# Patient Record
Sex: Female | Born: 1955 | Hispanic: Yes | Marital: Married | Smoking: Former smoker
Health system: Southern US, Community
[De-identification: ages and names within clinical notes are randomized; demographics above are authoritative.]

## PROBLEM LIST (undated history)

## (undated) DIAGNOSIS — I341 Nonrheumatic mitral (valve) prolapse: Secondary | ICD-10-CM

## (undated) DIAGNOSIS — J069 Acute upper respiratory infection, unspecified: Secondary | ICD-10-CM

## (undated) DIAGNOSIS — T4145XA Adverse effect of unspecified anesthetic, initial encounter: Secondary | ICD-10-CM

## (undated) DIAGNOSIS — I82409 Acute embolism and thrombosis of unspecified deep veins of unspecified lower extremity: Secondary | ICD-10-CM

## (undated) DIAGNOSIS — M797 Fibromyalgia: Secondary | ICD-10-CM

## (undated) DIAGNOSIS — R0602 Shortness of breath: Secondary | ICD-10-CM

## (undated) DIAGNOSIS — Z531 Procedure and treatment not carried out because of patient's decision for reasons of belief and group pressure: Secondary | ICD-10-CM

## (undated) DIAGNOSIS — R079 Chest pain, unspecified: Secondary | ICD-10-CM

## (undated) DIAGNOSIS — D649 Anemia, unspecified: Secondary | ICD-10-CM

## (undated) DIAGNOSIS — K589 Irritable bowel syndrome without diarrhea: Secondary | ICD-10-CM

## (undated) DIAGNOSIS — F32A Depression, unspecified: Secondary | ICD-10-CM

## (undated) DIAGNOSIS — J45909 Unspecified asthma, uncomplicated: Secondary | ICD-10-CM

## (undated) DIAGNOSIS — B009 Herpesviral infection, unspecified: Secondary | ICD-10-CM

## (undated) DIAGNOSIS — R51 Headache: Secondary | ICD-10-CM

## (undated) DIAGNOSIS — R011 Cardiac murmur, unspecified: Secondary | ICD-10-CM

## (undated) DIAGNOSIS — F329 Major depressive disorder, single episode, unspecified: Secondary | ICD-10-CM

## (undated) DIAGNOSIS — F419 Anxiety disorder, unspecified: Secondary | ICD-10-CM

## (undated) DIAGNOSIS — J189 Pneumonia, unspecified organism: Secondary | ICD-10-CM

## (undated) DIAGNOSIS — J302 Other seasonal allergic rhinitis: Secondary | ICD-10-CM

## (undated) DIAGNOSIS — I10 Essential (primary) hypertension: Secondary | ICD-10-CM

## (undated) DIAGNOSIS — T8859XA Other complications of anesthesia, initial encounter: Secondary | ICD-10-CM

## (undated) DIAGNOSIS — G4730 Sleep apnea, unspecified: Secondary | ICD-10-CM

## (undated) DIAGNOSIS — M1990 Unspecified osteoarthritis, unspecified site: Secondary | ICD-10-CM

## (undated) HISTORY — PX: OTHER SURGICAL HISTORY: SHX169

## (undated) HISTORY — PX: COLONOSCOPY: SHX174

---
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
## 1991-08-07 HISTORY — PX: BREAST SURGERY: SHX581

## 1992-08-06 HISTORY — PX: DILATION AND CURETTAGE OF UTERUS: SHX78

## 1993-08-06 HISTORY — PX: EYE SURGERY: SHX253

## 2014-03-24 ENCOUNTER — Other Ambulatory Visit: Payer: Self-pay | Admitting: Neurosurgery

## 2014-04-27 ENCOUNTER — Inpatient Hospital Stay (HOSPITAL_COMMUNITY)
Admission: RE | Admit: 2014-04-27 | Discharge: 2014-04-27 | Disposition: A | Discharge status: Home / Self Care | Payer: Self-pay | Source: Ambulatory Visit

## 2014-04-27 NOTE — Pre-Procedure Instructions (Signed)
Jill Collins  04/27/2014   Your procedure is scheduled on:  04/30/14  Report to [facility] short stay admitting at 530 AM.  Call this number if you have problems the morning of surgery: [phone]   Remember:   Do not eat food or drink liquids after midnight.   Take these medicines the morning of surgery with A SIP OF WATER:   STOP all herbel meds, nsaids (aleve,naproxen,advil,ibuprofen) now including vitamins, aspirin   Do not wear jewelry, make-up or nail polish.  Do not wear lotions, powders, or perfumes. You may wear deodorant.  Do not shave 48 hours prior to surgery. Men may shave face and neck.  Do not bring valuables to the hospital.  [facility] is not responsible                  for any belongings or valuables.               Contacts, dentures or bridgework may not be worn into surgery.  Leave suitcase in the car. After surgery it may be brought to your room.  For patients admitted to the hospital, discharge time is determined by your                treatment team.               Patients discharged the day of surgery will not be allowed to drive  home.  Name and phone number of your driver:   Special Instructions:  Special Instructions: [place] - Preparing for Surgery  Before surgery, you can play an important role.  Because skin is not sterile, your skin needs to be as free of germs as possible.  You can reduce the number of germs on you skin by washing with CHG (chlorahexidine gluconate) soap before surgery.  CHG is an antiseptic cleaner which kills germs and bonds with the skin to continue killing germs even after washing.  Please DO NOT use if you have an allergy to CHG or antibacterial soaps.  If your skin becomes reddened/irritated stop using the CHG and inform your nurse when you arrive at Short Stay.  Do not shave (including legs and underarms) for at least 48 hours prior to the first CHG shower.  You may shave your face.  Please follow these instructions  carefully:   1.  Shower with CHG Soap the night before surgery and the morning of Surgery.  2.  If you choose to wash your hair, wash your hair first as usual with your normal shampoo.  3.  After you shampoo, rinse your hair and body thoroughly to remove the Shampoo.  4.  Use CHG as you would any other liquid soap.  You can apply chg directly  to the skin and wash gently with scrungie or a clean washcloth.  5.  Apply the CHG Soap to your body ONLY FROM THE NECK DOWN.  Do not use on open wounds or open sores.  Avoid contact with your eyes ears, mouth and genitals (private parts).  Wash genitals (private parts)       with your normal soap.  6.  Wash thoroughly, paying special attention to the area where your surgery will be performed.  7.  Thoroughly rinse your body with warm water from the neck down.  8.  DO NOT shower/wash with your normal soap after using and rinsing off the CHG Soap.  9.  Pat yourself dry with a clean towel.  10.  Wear clean pajamas.            11.  Place clean sheets on your bed the night of your first shower and do not sleep with pets.  Day of Surgery  Do not apply any lotions/deodorants the morning of surgery.  Please wear clean clothes to the hospital/surgery center.   Please read over the following fact sheets that you were given: Pain Booklet, Coughing and Deep Breathing, MRSA Information and Surgical Site Infection Prevention

## 2014-04-28 ENCOUNTER — Ambulatory Visit (HOSPITAL_COMMUNITY)
Admission: RE | Admit: 2014-04-28 | Discharge: 2014-04-28 | Disposition: A | Discharge status: Home / Self Care | Payer: Medicaid Other | Source: Ambulatory Visit | Attending: Anesthesiology | Admitting: Anesthesiology

## 2014-04-28 ENCOUNTER — Encounter (HOSPITAL_COMMUNITY)
Admission: RE | Admit: 2014-04-28 | Discharge: 2014-04-28 | Disposition: A | Discharge status: Home / Self Care | Payer: Medicaid Other | Source: Ambulatory Visit | Attending: Neurosurgery | Admitting: Neurosurgery

## 2014-04-28 ENCOUNTER — Encounter (HOSPITAL_COMMUNITY): Payer: Self-pay

## 2014-04-28 DIAGNOSIS — R0602 Shortness of breath: Secondary | ICD-10-CM | POA: Diagnosis present

## 2014-04-28 HISTORY — DX: Unspecified osteoarthritis, unspecified site: M19.90

## 2014-04-28 HISTORY — DX: Pneumonia, unspecified organism: J18.9

## 2014-04-28 HISTORY — DX: Adverse effect of unspecified anesthetic, initial encounter: T41.45XA

## 2014-04-28 HISTORY — DX: Depression, unspecified: F32.A

## 2014-04-28 HISTORY — DX: Anemia, unspecified: D64.9

## 2014-04-28 HISTORY — DX: Chest pain, unspecified: R07.9

## 2014-04-28 HISTORY — DX: Unspecified asthma, uncomplicated: J45.909

## 2014-04-28 HISTORY — DX: Fibromyalgia: M79.7

## 2014-04-28 HISTORY — DX: Sleep apnea, unspecified: G47.30

## 2014-04-28 HISTORY — DX: Essential (primary) hypertension: I10

## 2014-04-28 HISTORY — DX: Herpesviral infection, unspecified: B00.9

## 2014-04-28 HISTORY — DX: Major depressive disorder, single episode, unspecified: F32.9

## 2014-04-28 HISTORY — DX: Other seasonal allergic rhinitis: J30.2

## 2014-04-28 HISTORY — DX: Acute embolism and thrombosis of unspecified deep veins of unspecified lower extremity: I82.409

## 2014-04-28 HISTORY — DX: Nonrheumatic mitral (valve) prolapse: I34.1

## 2014-04-28 HISTORY — DX: Irritable bowel syndrome without diarrhea: K58.9

## 2014-04-28 HISTORY — DX: Other complications of anesthesia, initial encounter: T88.59XA

## 2014-04-28 HISTORY — DX: Anxiety disorder, unspecified: F41.9

## 2014-04-28 HISTORY — DX: Headache: R51

## 2014-04-28 HISTORY — DX: Acute upper respiratory infection, unspecified: J06.9

## 2014-04-28 HISTORY — DX: Cardiac murmur, unspecified: R01.1

## 2014-04-28 HISTORY — DX: Shortness of breath: R06.02

## 2014-04-28 LAB — BASIC METABOLIC PANEL
Anion gap: 11 (ref 5–15)
BUN: 15 mg/dL (ref 6–23)
CALCIUM: 10.1 mg/dL (ref 8.4–10.5)
CO2: 27 meq/L (ref 19–32)
Chloride: 102 mEq/L (ref 96–112)
Creatinine, Ser: 0.83 mg/dL (ref 0.50–1.10)
GFR calc Af Amer: 89 mL/min — ABNORMAL LOW (ref >90)
GFR calc non Af Amer: 77 mL/min — ABNORMAL LOW (ref >90)
GLUCOSE: 85 mg/dL (ref 70–99)
POTASSIUM: 3.6 meq/L — AB (ref 3.7–5.3)
Sodium: 140 mEq/L (ref 137–147)

## 2014-04-28 LAB — CBC
HCT: 38.9 % (ref 36.0–46.0)
HEMOGLOBIN: 13.3 g/dL (ref 12.0–15.0)
MCH: 25.2 pg — AB (ref 26.0–34.0)
MCHC: 34.2 g/dL (ref 30.0–36.0)
MCV: 73.8 fL — ABNORMAL LOW (ref 78.0–100.0)
Platelets: 195 10*3/uL (ref 150–400)
RBC: 5.27 MIL/uL — AB (ref 3.87–5.11)
RDW: 12.7 % (ref 11.5–15.5)
WBC: 7.9 10*3/uL (ref 4.0–10.5)

## 2014-04-28 LAB — SURGICAL PCR SCREEN
MRSA, PCR: NEGATIVE
Staphylococcus aureus: NEGATIVE

## 2014-04-28 LAB — NO BLOOD PRODUCTS

## 2014-04-28 LAB — APTT: aPTT: 29 seconds (ref 24–37)

## 2014-04-28 LAB — PROTIME-INR
INR: 1.08 (ref 0.00–1.49)
PROTHROMBIN TIME: 14 s (ref 11.6–15.2)

## 2014-04-28 NOTE — Progress Notes (Addendum)
  Pt is a Jehovah's Witness, refusal faxed to blood bank.  PCP is Dr. Margarita Sermons with [facility] Internal Medicine [phone], [phone] or [phone]   Records in Epic.  Cardiologist is Dr. Lottie Dawson with [facility] Cardiology whom pt reports she has not seen in about 3 years which is when last ECHO and stress test was done. [phone] No records seen in Epic, fax sent.  Pt reports hx of chest pain ("heaviness"), last episode March 2015. Pt reports seeing PCP with no cardiac workup done due to diagnosis of bronchitis.   Hematologist is Dr. Varney Baas with [facility]. 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 Pt also reports hx of DVT as a teenager. Phone: [phone] Fax: [phone]  Records in Epic.

## 2014-04-28 NOTE — Pre-Procedure Instructions (Signed)
Jill Collins  04/28/2014   Your procedure is scheduled on:  04/30/14  Report to [facility] short stay admitting at 530 AM.  Call this number if you have problems the morning of surgery: [phone]   Remember:   Do not eat food or drink liquids after midnight.   Take these medicines the morning of surgery with A SIP OF WATER: acyclovir, inhaler, baclofen (if needed, Wellbutrin, Flexeril, Zyrtec & Mucinex (if needed), migraine medicine if needed  STOP all herbel meds, nsaids (aleve,naproxen,advil,ibuprofen) now including vitamins, aspirin   Do not wear jewelry, make-up or nail polish.  Do not wear lotions, powders, or perfumes. You may wear deodorant.  Do not shave 48 hours prior to surgery. Men may shave face and neck.  Do not bring valuables to the hospital.  [facility] is not responsible                  for any belongings or valuables.               Contacts, dentures or bridgework may not be worn into surgery.  Leave suitcase in the car. After surgery it may be brought to your room.  For patients admitted to the hospital, discharge time is determined by your                treatment team.               Patients discharged the day of surgery will not be allowed to drive  home.  Name and phone number of your driver:   Special Instructions:  Special Instructions: [place] - Preparing for Surgery  Before surgery, you can play an important role.  Because skin is not sterile, your skin needs to be as free of germs as possible.  You can reduce the number of germs on you skin by washing with CHG (chlorahexidine gluconate) soap before surgery.  CHG is an antiseptic cleaner which kills germs and bonds with the skin to continue killing germs even after washing.  Please DO NOT use if you have an allergy to CHG or antibacterial soaps.  If your skin becomes reddened/irritated stop using the CHG and inform your nurse when you arrive at Short Stay.  Do not shave (including legs and underarms)  for at least 48 hours prior to the first CHG shower.  You may shave your face.  Please follow these instructions carefully:   1.  Shower with CHG Soap the night before surgery and the morning of Surgery.  2.  If you choose to wash your hair, wash your hair first as usual with your normal shampoo.  3.  After you shampoo, rinse your hair and body thoroughly to remove the Shampoo.  4.  Use CHG as you would any other liquid soap.  You can apply chg directly  to the skin and wash gently with scrungie or a clean washcloth.  5.  Apply the CHG Soap to your body ONLY FROM THE NECK DOWN.  Do not use on open wounds or open sores.  Avoid contact with your eyes ears, mouth and genitals (private parts).  Wash genitals (private parts)       with your normal soap.  6.  Wash thoroughly, paying special attention to the area where your surgery will be performed.  7.  Thoroughly rinse your body with warm water from the neck down.  8.  DO NOT shower/wash with your normal soap after using and rinsing off the  CHG Soap.  9.  Pat yourself dry with a clean towel.            10.  Wear clean pajamas.            11.  Place clean sheets on your bed the night of your first shower and do not sleep with pets.  Day of Surgery  Do not apply any lotions/deodorants the morning of surgery.  Please wear clean clothes to the hospital/surgery center.   Please read over the following fact sheets that you were given: Pain Booklet, Coughing and Deep Breathing, MRSA Information and Surgical Site Infection Prevention

## 2014-04-29 MED ORDER — CEFAZOLIN SODIUM-DEXTROSE 2-3 GM-% IV SOLR
2.0000 g | INTRAVENOUS | Status: AC
  Administered 2014-04-30 (×2): 2 g via INTRAVENOUS
  Filled 2014-04-29: qty 50

## 2014-04-29 MED ORDER — LACTATED RINGERS IV SOLN: INTRAVENOUS | Status: DC

## 2014-04-29 NOTE — Progress Notes (Signed)
Anesthesia Chart Review:  Pt is 59 year old female scheduled for C3-4, C4-5, C5-6 anterior cervical decompression/discectomy on 04/30/14 with Dr. Franky Macho.   Pt is Jehovah's Witness.   PCP: Margarita Sermons with [facility] Cardiologist Dr. Juliane Lack with [facility]. Pt has not been seen since 2012.   PMH: mitral valve prolapse, asthma, HTN, heart murmur (unspecified), OSA, DVT.   Per hematologist, Dr. Varney Baas, pt does not have hypercoagulable state and may proceed with surgery as planned. (Notes in care everywhere dated 01/13/14).    Medications include: losartan, ASA, albuterol, maxalt, topamax  Preoperative labs reviewed.    Chest x-ray reviewed. No acute cardiopulmonary disease.   EKG: NSR with sinus arrhythmia. Septal infarct (age undetermined).  Appears unchanged from previous 09/28/2010 (on paper chart).   Stress test 10/12/2010:  Impression:  There is no scintigraphic evidence of inducible myocardial ischemia.  Post-stress EF is 69%. Global LV systolic function normal.  Exercise capacity 7 METS.   Discussed with Dr. Jacklynn Bue.   If no changes, I anticipate pt can proceed with surgery as scheduled.   Rica Mast, FNP-BC [facility]/Anesthesiology Phone: [phone] 04/29/2014 2:11 PM

## 2014-04-29 NOTE — Anesthesia Preprocedure Evaluation (Addendum)
Anesthesia Evaluation  Patient identified by MRN, date of birth, ID band Patient awake    Reviewed: Allergy & Precautions, H&P , NPO status , Patient's Chart, lab work & pertinent test results  History of Anesthesia Complications Negative for: history of anesthetic complications  Airway Mallampati: II TM Distance: >3 FB Neck ROM: Full    Dental  (+) Teeth Intact, Dental Advisory Given   Pulmonary asthma , sleep apnea , Recent URI , former smoker,    Pulmonary exam normal       Cardiovascular hypertension, Pt. on medications     Neuro/Psych PSYCHIATRIC DISORDERS Anxiety Depression    GI/Hepatic negative GI ROS, Neg liver ROS,   Endo/Other  negative endocrine ROS  Renal/GU negative Renal ROS     Musculoskeletal  (+) Fibromyalgia -  Abdominal   Peds  Hematology   Anesthesia Other Findings   Reproductive/Obstetrics                          Anesthesia Physical Anesthesia Plan  ASA: II  Anesthesia Plan: General   Post-op Pain Management:    Induction: Intravenous  Airway Management Planned: Oral ETT  Additional Equipment:   Intra-op Plan:   Post-operative Plan: Extubation in OR  Informed Consent: I have reviewed the patients History and Physical, chart, labs and discussed the procedure including the risks, benefits and alternatives for the proposed anesthesia with the patient or authorized representative who has indicated his/her understanding and acceptance.   Dental advisory given  Plan Discussed with: CRNA, Anesthesiologist and Surgeon  Anesthesia Plan Comments:        Anesthesia Quick Evaluation

## 2014-04-30 ENCOUNTER — Encounter (HOSPITAL_COMMUNITY): Payer: Medicaid Other | Admitting: Emergency Medicine

## 2014-04-30 ENCOUNTER — Inpatient Hospital Stay (HOSPITAL_COMMUNITY): Payer: Medicaid Other

## 2014-04-30 ENCOUNTER — Encounter (HOSPITAL_COMMUNITY): Payer: Self-pay | Admitting: *Deleted

## 2014-04-30 ENCOUNTER — Observation Stay (HOSPITAL_COMMUNITY)
Admission: RE | Admit: 2014-04-30 | Discharge: 2014-05-02 | Disposition: A | Discharge status: Home / Self Care | Payer: Medicaid Other | Source: Ambulatory Visit | Attending: Neurosurgery | Admitting: Neurosurgery

## 2014-04-30 ENCOUNTER — Inpatient Hospital Stay (HOSPITAL_COMMUNITY): Payer: Medicaid Other | Admitting: Anesthesiology

## 2014-04-30 ENCOUNTER — Encounter (HOSPITAL_COMMUNITY)
Admission: RE | Disposition: A | Discharge status: Home / Self Care | Payer: Self-pay | Source: Ambulatory Visit | Attending: Neurosurgery

## 2014-04-30 DIAGNOSIS — G4730 Sleep apnea, unspecified: Secondary | ICD-10-CM | POA: Insufficient documentation

## 2014-04-30 DIAGNOSIS — Z7982 Long term (current) use of aspirin: Secondary | ICD-10-CM | POA: Insufficient documentation

## 2014-04-30 DIAGNOSIS — Z87891 Personal history of nicotine dependence: Secondary | ICD-10-CM | POA: Diagnosis not present

## 2014-04-30 DIAGNOSIS — I10 Essential (primary) hypertension: Secondary | ICD-10-CM | POA: Insufficient documentation

## 2014-04-30 DIAGNOSIS — F329 Major depressive disorder, single episode, unspecified: Secondary | ICD-10-CM | POA: Diagnosis not present

## 2014-04-30 DIAGNOSIS — M47812 Spondylosis without myelopathy or radiculopathy, cervical region: Secondary | ICD-10-CM

## 2014-04-30 DIAGNOSIS — Z79899 Other long term (current) drug therapy: Secondary | ICD-10-CM | POA: Insufficient documentation

## 2014-04-30 DIAGNOSIS — M4712 Other spondylosis with myelopathy, cervical region: Principal | ICD-10-CM | POA: Insufficient documentation

## 2014-04-30 DIAGNOSIS — F3289 Other specified depressive episodes: Secondary | ICD-10-CM | POA: Insufficient documentation

## 2014-04-30 DIAGNOSIS — J45909 Unspecified asthma, uncomplicated: Secondary | ICD-10-CM | POA: Insufficient documentation

## 2014-04-30 DIAGNOSIS — G43909 Migraine, unspecified, not intractable, without status migrainosus: Secondary | ICD-10-CM | POA: Diagnosis not present

## 2014-04-30 HISTORY — DX: Procedure and treatment not carried out because of patient's decision for reasons of belief and group pressure: Z53.1

## 2014-04-30 HISTORY — PX: ANTERIOR CERVICAL DECOMP/DISCECTOMY FUSION: SHX1161

## 2014-04-30 SURGERY — ANTERIOR CERVICAL DECOMPRESSION/DISCECTOMY FUSION 3 LEVELS
Anesthesia: General

## 2014-04-30 MED ORDER — BUPROPION HCL ER (SR) 150 MG PO TB12
150.0000 mg | ORAL_TABLET | Freq: Two times a day (BID) | ORAL | Status: DC
Start: 2014-04-30 — End: 2014-05-02
  Administered 2014-04-30 – 2014-05-02 (×4): 150 mg via ORAL
  Filled 2014-04-30 (×4): qty 1

## 2014-04-30 MED ORDER — MIDAZOLAM HCL 2 MG/2ML IJ SOLN
INTRAMUSCULAR | Status: AC
  Filled 2014-04-30: qty 2

## 2014-04-30 MED ORDER — ACETAMINOPHEN 650 MG RE SUPP: 650.0000 mg | RECTAL | Status: DC | PRN

## 2014-04-30 MED ORDER — ALBUMIN HUMAN 5 % IV SOLN
INTRAVENOUS | Status: DC | PRN
  Administered 2014-04-30: 11:00:00 via INTRAVENOUS

## 2014-04-30 MED ORDER — HYDROCODONE-ACETAMINOPHEN 5-325 MG PO TABS
1.0000 | ORAL_TABLET | ORAL | Status: DC | PRN
  Administered 2014-05-01: 2 via ORAL
  Administered 2014-05-02: 1 via ORAL
  Filled 2014-04-30: qty 2
  Filled 2014-04-30: qty 1

## 2014-04-30 MED ORDER — POLYETHYL GLYCOL-PROPYL GLYCOL 0.4-0.3 % OP SOLN: 1.0000 [drp] | OPHTHALMIC | Status: DC | PRN

## 2014-04-30 MED ORDER — ALBUTEROL SULFATE HFA 108 (90 BASE) MCG/ACT IN AERS: 2.0000 | INHALATION_SPRAY | Freq: Four times a day (QID) | RESPIRATORY_TRACT | Status: DC | PRN

## 2014-04-30 MED ORDER — NEOSTIGMINE METHYLSULFATE 10 MG/10ML IV SOLN
INTRAVENOUS | Status: DC | PRN
  Administered 2014-04-30: 3 mg via INTRAVENOUS

## 2014-04-30 MED ORDER — CEFAZOLIN SODIUM 1-5 GM-% IV SOLN
1.0000 g | Freq: Three times a day (TID) | INTRAVENOUS | Status: AC
  Administered 2014-04-30 – 2014-05-01 (×2): 1 g via INTRAVENOUS
  Filled 2014-04-30 (×3): qty 50

## 2014-04-30 MED ORDER — SCOPOLAMINE 1 MG/3DAYS TD PT72
MEDICATED_PATCH | TRANSDERMAL | Status: AC
  Administered 2014-04-30: 1 via TRANSDERMAL
  Filled 2014-04-30: qty 1

## 2014-04-30 MED ORDER — SODIUM CHLORIDE 0.9 % IJ SOLN
3.0000 mL | Freq: Two times a day (BID) | INTRAMUSCULAR | Status: DC
  Administered 2014-05-01 (×2): 3 mL via INTRAVENOUS

## 2014-04-30 MED ORDER — LORATADINE 10 MG PO TABS
10.0000 mg | ORAL_TABLET | Freq: Every day | ORAL | Status: DC
  Administered 2014-04-30 – 2014-05-02 (×3): 10 mg via ORAL
  Filled 2014-04-30 (×4): qty 1

## 2014-04-30 MED ORDER — BACLOFEN 10 MG PO TABS: 10.0000 mg | ORAL_TABLET | Freq: Three times a day (TID) | ORAL | Status: DC | PRN

## 2014-04-30 MED ORDER — MORPHINE SULFATE 2 MG/ML IJ SOLN
1.0000 mg | INTRAMUSCULAR | Status: DC | PRN
  Administered 2014-04-30 – 2014-05-01 (×3): 2 mg via INTRAVENOUS
  Filled 2014-04-30 (×3): qty 1

## 2014-04-30 MED ORDER — MIDAZOLAM HCL 5 MG/5ML IJ SOLN
INTRAMUSCULAR | Status: DC | PRN
  Administered 2014-04-30: 2 mg via INTRAVENOUS

## 2014-04-30 MED ORDER — SENNA 8.6 MG PO TABS
1.0000 | ORAL_TABLET | Freq: Two times a day (BID) | ORAL | Status: DC
  Administered 2014-04-30 – 2014-05-02 (×4): 8.6 mg via ORAL
  Filled 2014-04-30 (×4): qty 1

## 2014-04-30 MED ORDER — TOPIRAMATE 25 MG PO TABS
25.0000 mg | ORAL_TABLET | Freq: Three times a day (TID) | ORAL | Status: DC
  Administered 2014-04-30 – 2014-05-01 (×3): 25 mg via ORAL
  Filled 2014-04-30 (×3): qty 1

## 2014-04-30 MED ORDER — FENTANYL CITRATE 0.05 MG/ML IJ SOLN
INTRAMUSCULAR | Status: AC
  Filled 2014-04-30: qty 5

## 2014-04-30 MED ORDER — ROCURONIUM BROMIDE 50 MG/5ML IV SOLN
INTRAVENOUS | Status: AC
  Filled 2014-04-30: qty 1

## 2014-04-30 MED ORDER — PROPOFOL 10 MG/ML IV BOLUS
INTRAVENOUS | Status: DC | PRN
  Administered 2014-04-30: 200 mg via INTRAVENOUS

## 2014-04-30 MED ORDER — LIDOCAINE-EPINEPHRINE 0.5 %-1:200000 IJ SOLN
INTRAMUSCULAR | Status: DC | PRN
  Administered 2014-04-30: 10 mL

## 2014-04-30 MED ORDER — SODIUM CHLORIDE 0.9 % IV SOLN: 250.0000 mL | INTRAVENOUS | Status: DC

## 2014-04-30 MED ORDER — DEXAMETHASONE SODIUM PHOSPHATE 4 MG/ML IJ SOLN
INTRAMUSCULAR | Status: DC | PRN
  Administered 2014-04-30: 10 mg via INTRAVENOUS

## 2014-04-30 MED ORDER — LACTATED RINGERS IV SOLN
INTRAVENOUS | Status: DC | PRN
  Administered 2014-04-30 (×3): via INTRAVENOUS

## 2014-04-30 MED ORDER — DM-GUAIFENESIN ER 30-600 MG PO TB12
1.0000 | ORAL_TABLET | Freq: Every day | ORAL | Status: DC
  Administered 2014-04-30 – 2014-05-02 (×3): 1 via ORAL
  Filled 2014-04-30 (×4): qty 1

## 2014-04-30 MED ORDER — SUPER B COMPLEX PO TABS: 1.0000 | ORAL_TABLET | Freq: Every day | ORAL | Status: DC

## 2014-04-30 MED ORDER — PHENOL 1.4 % MT LIQD
1.0000 | OROMUCOSAL | Status: DC | PRN
  Administered 2014-04-30: 1 via OROMUCOSAL
  Filled 2014-04-30: qty 177

## 2014-04-30 MED ORDER — LIDOCAINE HCL 4 % MT SOLN
OROMUCOSAL | Status: DC | PRN
  Administered 2014-04-30: 3 mL via TOPICAL

## 2014-04-30 MED ORDER — PROPOFOL 10 MG/ML IV BOLUS
INTRAVENOUS | Status: AC
  Filled 2014-04-30: qty 20

## 2014-04-30 MED ORDER — PROMETHAZINE HCL 25 MG/ML IJ SOLN: 6.2500 mg | INTRAMUSCULAR | Status: DC | PRN

## 2014-04-30 MED ORDER — POTASSIUM CHLORIDE IN NACL 20-0.9 MEQ/L-% IV SOLN
INTRAVENOUS | Status: DC
  Administered 2014-04-30 – 2014-05-01 (×2): 980 mL via INTRAVENOUS
  Filled 2014-04-30 (×2): qty 1000

## 2014-04-30 MED ORDER — ACYCLOVIR 800 MG PO TABS
400.0000 mg | ORAL_TABLET | Freq: Two times a day (BID) | ORAL | Status: DC
  Administered 2014-04-30 – 2014-05-02 (×4): 400 mg via ORAL
  Filled 2014-04-30 (×4): qty 1

## 2014-04-30 MED ORDER — LIDOCAINE HCL (CARDIAC) 20 MG/ML IV SOLN
INTRAVENOUS | Status: DC | PRN
  Administered 2014-04-30: 100 mg via INTRAVENOUS

## 2014-04-30 MED ORDER — LIDOCAINE HCL (CARDIAC) 20 MG/ML IV SOLN
INTRAVENOUS | Status: AC
  Filled 2014-04-30: qty 5

## 2014-04-30 MED ORDER — ONDANSETRON HCL 4 MG/2ML IJ SOLN
INTRAMUSCULAR | Status: DC | PRN
  Administered 2014-04-30: 4 mg via INTRAVENOUS

## 2014-04-30 MED ORDER — 0.9 % SODIUM CHLORIDE (POUR BTL) OPTIME
TOPICAL | Status: DC | PRN
  Administered 2014-04-30: 1000 mL

## 2014-04-30 MED ORDER — HYDROMORPHONE HCL 1 MG/ML IJ SOLN
INTRAMUSCULAR | Status: AC
  Filled 2014-04-30: qty 1

## 2014-04-30 MED ORDER — FLUTICASONE PROPIONATE 50 MCG/ACT NA SUSP
1.0000 | Freq: Every day | NASAL | Status: DC
  Administered 2014-04-30 – 2014-05-02 (×3): 1 via NASAL
  Filled 2014-04-30: qty 16

## 2014-04-30 MED ORDER — ONDANSETRON HCL 4 MG/2ML IJ SOLN: 4.0000 mg | INTRAMUSCULAR | Status: DC | PRN

## 2014-04-30 MED ORDER — EPHEDRINE SULFATE 50 MG/ML IJ SOLN
INTRAMUSCULAR | Status: AC
  Filled 2014-04-30: qty 1

## 2014-04-30 MED ORDER — SUMATRIPTAN SUCCINATE 50 MG PO TABS
50.0000 mg | ORAL_TABLET | Freq: Once | ORAL | Status: DC
  Filled 2014-04-30: qty 1

## 2014-04-30 MED ORDER — GLYCOPYRROLATE 0.2 MG/ML IJ SOLN
INTRAMUSCULAR | Status: DC | PRN
  Administered 2014-04-30: 0.4 mg via INTRAVENOUS

## 2014-04-30 MED ORDER — ACETAMINOPHEN 325 MG PO TABS
650.0000 mg | ORAL_TABLET | ORAL | Status: DC | PRN
Start: 2014-04-30 — End: 2014-05-02

## 2014-04-30 MED ORDER — FENTANYL CITRATE 0.05 MG/ML IJ SOLN
INTRAMUSCULAR | Status: DC | PRN
  Administered 2014-04-30 (×2): 50 ug via INTRAVENOUS
  Administered 2014-04-30: 100 ug via INTRAVENOUS
  Administered 2014-04-30: 50 ug via INTRAVENOUS
  Administered 2014-04-30: 25 ug via INTRAVENOUS
  Administered 2014-04-30 (×2): 50 ug via INTRAVENOUS

## 2014-04-30 MED ORDER — OXYCODONE HCL 5 MG/5ML PO SOLN: 5.0000 mg | Freq: Once | ORAL | Status: DC | PRN

## 2014-04-30 MED ORDER — SODIUM CHLORIDE 0.9 % IJ SOLN: 3.0000 mL | INTRAMUSCULAR | Status: DC | PRN

## 2014-04-30 MED ORDER — PHENYLEPHRINE HCL 10 MG/ML IJ SOLN
INTRAMUSCULAR | Status: DC | PRN
  Administered 2014-04-30 (×3): 80 ug via INTRAVENOUS
  Administered 2014-04-30: 40 ug via INTRAVENOUS

## 2014-04-30 MED ORDER — MENTHOL 3 MG MT LOZG: 1.0000 | LOZENGE | OROMUCOSAL | Status: DC | PRN

## 2014-04-30 MED ORDER — POLYVINYL ALCOHOL 1.4 % OP SOLN
1.0000 [drp] | OPHTHALMIC | Status: DC | PRN
Start: 2014-04-30 — End: 2014-05-02
  Filled 2014-04-30: qty 15

## 2014-04-30 MED ORDER — SURGIFOAM 100 EX MISC
CUTANEOUS | Status: DC | PRN
  Administered 2014-04-30: 10:00:00 via TOPICAL

## 2014-04-30 MED ORDER — OXYCODONE-ACETAMINOPHEN 5-325 MG PO TABS
1.0000 | ORAL_TABLET | ORAL | Status: DC | PRN
  Administered 2014-04-30 – 2014-05-02 (×7): 2 via ORAL
  Filled 2014-04-30 (×7): qty 2

## 2014-04-30 MED ORDER — B COMPLEX-C PO TABS
1.0000 | ORAL_TABLET | Freq: Every day | ORAL | Status: DC
  Administered 2014-05-01 – 2014-05-02 (×2): 1 via ORAL
  Filled 2014-04-30 (×2): qty 1

## 2014-04-30 MED ORDER — NAPROXEN 250 MG PO TABS: 500.0000 mg | ORAL_TABLET | Freq: Two times a day (BID) | ORAL | Status: DC | PRN

## 2014-04-30 MED ORDER — SODIUM CHLORIDE 0.9 % IJ SOLN
INTRAMUSCULAR | Status: AC
  Filled 2014-04-30: qty 10

## 2014-04-30 MED ORDER — ALBUTEROL SULFATE (2.5 MG/3ML) 0.083% IN NEBU: 2.5000 mg | INHALATION_SOLUTION | Freq: Four times a day (QID) | RESPIRATORY_TRACT | Status: DC | PRN

## 2014-04-30 MED ORDER — CYCLOBENZAPRINE HCL 10 MG PO TABS: 10.0000 mg | ORAL_TABLET | Freq: Three times a day (TID) | ORAL | Status: AC | PRN

## 2014-04-30 MED ORDER — PHENYLEPHRINE HCL 10 MG/ML IJ SOLN
10.0000 mg | INTRAMUSCULAR | Status: DC | PRN
  Administered 2014-04-30: 5 ug/min via INTRAVENOUS

## 2014-04-30 MED ORDER — PHENYLEPHRINE 40 MCG/ML (10ML) SYRINGE FOR IV PUSH (FOR BLOOD PRESSURE SUPPORT)
PREFILLED_SYRINGE | INTRAVENOUS | Status: AC
  Filled 2014-04-30: qty 10

## 2014-04-30 MED ORDER — ROCURONIUM BROMIDE 100 MG/10ML IV SOLN
INTRAVENOUS | Status: DC | PRN
  Administered 2014-04-30 (×3): 10 mg via INTRAVENOUS
  Administered 2014-04-30: 50 mg via INTRAVENOUS

## 2014-04-30 MED ORDER — OXYCODONE HCL 5 MG PO TABS
5.0000 mg | ORAL_TABLET | Freq: Once | ORAL | Status: DC | PRN
Start: 2014-04-30 — End: 2014-04-30

## 2014-04-30 MED ORDER — LOSARTAN POTASSIUM 50 MG PO TABS
50.0000 mg | ORAL_TABLET | Freq: Every day | ORAL | Status: DC
  Administered 2014-04-30 – 2014-05-02 (×3): 50 mg via ORAL
  Filled 2014-04-30 (×3): qty 1

## 2014-04-30 MED ORDER — DEXAMETHASONE SODIUM PHOSPHATE 4 MG/ML IJ SOLN
INTRAMUSCULAR | Status: AC
  Filled 2014-04-30: qty 3

## 2014-04-30 MED ORDER — HYDROMORPHONE HCL 1 MG/ML IJ SOLN
0.2500 mg | INTRAMUSCULAR | Status: DC | PRN
  Administered 2014-04-30: 0.25 mg via INTRAVENOUS

## 2014-04-30 MED ORDER — VITAMIN D 1000 UNITS PO TABS
1000.0000 [IU] | ORAL_TABLET | Freq: Every day | ORAL | Status: DC
  Administered 2014-05-01 – 2014-05-02 (×2): 1000 [IU] via ORAL
  Filled 2014-04-30 (×2): qty 1

## 2014-04-30 MED ORDER — VITAMIN D-3 125 MCG (5000 UT) PO TABS: 1.0000 | ORAL_TABLET | Freq: Every day | ORAL | Status: DC

## 2014-04-30 MED ORDER — OXYCODONE-ACETAMINOPHEN 5-325 MG PO TABS: 1.0000 | ORAL_TABLET | Freq: Four times a day (QID) | ORAL | Status: AC | PRN

## 2014-04-30 SURGICAL SUPPLY — 78 items
BIT DRILL NEURO 2X3.1 SFT TUCH (MISCELLANEOUS) ×1 IMPLANT
BLADE SURG ROTATE 9660 (MISCELLANEOUS) IMPLANT
BNDG GAUZE ELAST 4 BULKY (GAUZE/BANDAGES/DRESSINGS) IMPLANT
BUR DRUM 4.0 (BURR) IMPLANT
CANISTER SUCT 3000ML (MISCELLANEOUS) ×2 IMPLANT
CONT SPEC 4OZ CLIKSEAL STRL BL (MISCELLANEOUS) ×2 IMPLANT
DECANTER SPIKE VIAL GLASS SM (MISCELLANEOUS) ×2 IMPLANT
DERMABOND ADHESIVE PROPEN (GAUZE/BANDAGES/DRESSINGS) ×1
DERMABOND ADVANCED (GAUZE/BANDAGES/DRESSINGS) ×1
DERMABOND ADVANCED .7 DNX12 (GAUZE/BANDAGES/DRESSINGS) ×1 IMPLANT
DERMABOND ADVANCED .7 DNX6 (GAUZE/BANDAGES/DRESSINGS) ×1 IMPLANT
DRAPE LAPAROTOMY 100X72 PEDS (DRAPES) ×2 IMPLANT
DRAPE MICROSCOPE LEICA (MISCELLANEOUS) ×2 IMPLANT
DRAPE POUCH INSTRU U-SHP 10X18 (DRAPES) ×2 IMPLANT
DRAPE PROXIMA HALF (DRAPES) IMPLANT
DRILL NEURO 2X3.1 SOFT TOUCH (MISCELLANEOUS) ×2
DURAPREP 6ML APPLICATOR 50/CS (WOUND CARE) ×2 IMPLANT
ELECT COATED BLADE 2.86 ST (ELECTRODE) ×2 IMPLANT
ELECT REM PT RETURN 9FT ADLT (ELECTROSURGICAL) ×2
ELECTRODE REM PT RTRN 9FT ADLT (ELECTROSURGICAL) ×1 IMPLANT
GAUZE SPONGE 4X4 16PLY XRAY LF (GAUZE/BANDAGES/DRESSINGS) IMPLANT
GLOVE BIO SURGEON STRL SZ 6.5 (GLOVE) IMPLANT
GLOVE BIO SURGEON STRL SZ7 (GLOVE) IMPLANT
GLOVE BIO SURGEON STRL SZ7.5 (GLOVE) IMPLANT
GLOVE BIO SURGEON STRL SZ8 (GLOVE) IMPLANT
GLOVE BIO SURGEON STRL SZ8.5 (GLOVE) IMPLANT
GLOVE BIOGEL M 8.0 STRL (GLOVE) IMPLANT
GLOVE BIOGEL PI IND STRL 7.0 (GLOVE) ×7 IMPLANT
GLOVE BIOGEL PI INDICATOR 7.0 (GLOVE) ×7
GLOVE ECLIPSE 6.5 STRL STRAW (GLOVE) ×2 IMPLANT
GLOVE ECLIPSE 7.0 STRL STRAW (GLOVE) IMPLANT
GLOVE ECLIPSE 7.5 STRL STRAW (GLOVE) IMPLANT
GLOVE ECLIPSE 8.0 STRL XLNG CF (GLOVE) IMPLANT
GLOVE ECLIPSE 8.5 STRL (GLOVE) IMPLANT
GLOVE EXAM NITRILE LRG STRL (GLOVE) IMPLANT
GLOVE EXAM NITRILE MD LF STRL (GLOVE) IMPLANT
GLOVE EXAM NITRILE XL STR (GLOVE) IMPLANT
GLOVE EXAM NITRILE XS STR PU (GLOVE) IMPLANT
GLOVE INDICATOR 6.5 STRL GRN (GLOVE) IMPLANT
GLOVE INDICATOR 7.0 STRL GRN (GLOVE) IMPLANT
GLOVE INDICATOR 7.5 STRL GRN (GLOVE) IMPLANT
GLOVE INDICATOR 8.0 STRL GRN (GLOVE) IMPLANT
GLOVE INDICATOR 8.5 STRL (GLOVE) IMPLANT
GLOVE OPTIFIT SS 8.0 STRL (GLOVE) IMPLANT
GLOVE SURG SS PI 6.5 STRL IVOR (GLOVE) IMPLANT
GLOVE SURG SS PI 7.0 STRL IVOR (GLOVE) ×10 IMPLANT
GOWN STRL REUS W/ TWL LRG LVL3 (GOWN DISPOSABLE) ×3 IMPLANT
GOWN STRL REUS W/ TWL XL LVL3 (GOWN DISPOSABLE) ×4 IMPLANT
GOWN STRL REUS W/TWL 2XL LVL3 (GOWN DISPOSABLE) ×2 IMPLANT
GOWN STRL REUS W/TWL LRG LVL3 (GOWN DISPOSABLE) ×3
GOWN STRL REUS W/TWL XL LVL3 (GOWN DISPOSABLE) ×4
HALTER HD/CHIN CERV TRACTION D (MISCELLANEOUS) ×2 IMPLANT
HEMOSTAT SURGICEL 2X14 (HEMOSTASIS) IMPLANT
KIT BASIN OR (CUSTOM PROCEDURE TRAY) ×2 IMPLANT
KIT ROOM TURNOVER OR (KITS) ×2 IMPLANT
NEEDLE HYPO 25X1 1.5 SAFETY (NEEDLE) ×2 IMPLANT
NEEDLE SPNL 22GX3.5 QUINCKE BK (NEEDLE) ×2 IMPLANT
NS IRRIG 1000ML POUR BTL (IV SOLUTION) ×2 IMPLANT
PACK LAMINECTOMY NEURO (CUSTOM PROCEDURE TRAY) ×2 IMPLANT
PAD ARMBOARD 7.5X6 YLW CONV (MISCELLANEOUS) ×2 IMPLANT
PATTIES SURGICAL .5 X.5 (GAUZE/BANDAGES/DRESSINGS) ×2 IMPLANT
PIN DISTRACTION 14MM (PIN) ×4 IMPLANT
PLATE HELIX T 54MM (Plate) ×2 IMPLANT
RUBBERBAND STERILE (MISCELLANEOUS) ×4 IMPLANT
SCREW FIXED SELF TAP 4.0X13MM (Screw) ×4 IMPLANT
SCREW SELF TAP 13MM VARIABLE (Screw) ×12 IMPLANT
SPACER CC-ACF 8MM PARALLEL (Bone Implant) ×2 IMPLANT
SPACER PARALLEL 6MM CC ACF (Bone Implant) ×2 IMPLANT
SPACER PARALLELL CC-ACF 5MM (Bone Implant) ×2 IMPLANT
SPONGE INTESTINAL PEANUT (DISPOSABLE) ×2 IMPLANT
SPONGE SURGIFOAM ABS GEL 100 (HEMOSTASIS) IMPLANT
SUT VIC AB 0 CT1 27 (SUTURE) ×1
SUT VIC AB 0 CT1 27XBRD ANTBC (SUTURE) ×1 IMPLANT
SUT VIC AB 3-0 SH 8-18 (SUTURE) ×2 IMPLANT
SYR 20ML ECCENTRIC (SYRINGE) ×2 IMPLANT
TOWEL OR 17X24 6PK STRL BLUE (TOWEL DISPOSABLE) ×2 IMPLANT
TOWEL OR 17X26 10 PK STRL BLUE (TOWEL DISPOSABLE) ×2 IMPLANT
WATER STERILE IRR 1000ML POUR (IV SOLUTION) ×2 IMPLANT

## 2014-04-30 NOTE — Progress Notes (Signed)
Patient received to room from PACU; transported via stretcher.  Patient is alert and oriented; able to slowly make move to bed.  Report received from [facility].  Vitals obtained and charted.  No reports of pain verbalized at this time.  Patient requested that foley be removed.  Informed patient that we could and would remove foley if that is what pt. Desired. Order placed for soft diet;see how patient tolerates at this time.  Orders reconciled and acknowledged.

## 2014-04-30 NOTE — Discharge Summary (Signed)
Physician Discharge Summary  Patient ID: Jill Collins MRN: [id number] DOB/AGE: 1956/05/26 58 y.o.  Admit date: 04/30/2014 Discharge date: 04/30/2014  Admission Diagnoses:Cervical stenosis with myelopathy 3/4,4/5,5/6  Discharge Diagnoses:  Active Problems:   Cervical spondylosis   Discharged Condition: good  Hospital Course: Jill Collins was admitted and taken to the operating room for an uncomplicated ACDF at 3/4,4/5,5/6. Post op she is voiding, tolerating a regular diet, and ambulating well. Her wound is clean, dry, and without signs of infection. She is moving all extremities well.   Treatments: surgery: ACDF C3/4,4/5,and 5/6 Nuvasive  Discharge Exam: Blood pressure 152/95, pulse 85, temperature 97.6 F (36.4 C), temperature source Oral, resp. rate 16, weight 80.74 kg (178 lb), SpO2 98.00%. General appearance: alert, cooperative and appears stated age Neurologic: Mental status: Alert, oriented, thought content appropriate Cranial nerves: normal Motor: moving all extremities well  Disposition: Final discharge disposition not confirmed cervical spondylosis with myelopathy    Medication List         acyclovir 400 MG tablet  Commonly known as:  ZOVIRAX  Take 400 mg by mouth 2 (two) times daily.     albuterol 108 (90 BASE) MCG/ACT inhaler  Commonly known as:  PROVENTIL HFA;VENTOLIN HFA  Inhale into the lungs every 6 (six) hours as needed for wheezing or shortness of breath.     aspirin 325 MG EC tablet  Take 325 mg by mouth daily.     baclofen 10 MG tablet  Commonly known as:  LIORESAL  Take 10 mg by mouth 3 (three) times daily as needed (for migraines).     buPROPion 150 MG 12 hr tablet  Commonly known as:  WELLBUTRIN SR  Take 150 mg by mouth 2 (two) times daily.     cetirizine 10 MG tablet  Commonly known as:  ZYRTEC  Take 10 mg by mouth daily.     cyclobenzaprine 10 MG tablet  Commonly known as:  FLEXERIL  Take 10 mg by mouth 2 (two) times daily as needed  for muscle spasms.     cyclobenzaprine 10 MG tablet  Commonly known as:  FLEXERIL  Take 1 tablet (10 mg total) by mouth 3 (three) times daily as needed for muscle spasms.     dextromethorphan-guaiFENesin 30-600 MG per 12 hr tablet  Commonly known as:  MUCINEX DM  Take 1 tablet by mouth daily.     fluticasone 50 MCG/ACT nasal spray  Commonly known as:  FLONASE  Place 1 spray into both nostrils daily.     losartan 50 MG tablet  Commonly known as:  COZAAR  Take 50 mg by mouth daily.     naproxen 500 MG tablet  Commonly known as:  NAPROSYN  Take 500 mg by mouth 2 (two) times daily as needed for mild pain.     oxyCODONE-acetaminophen 5-325 MG per tablet  Commonly known as:  ROXICET  Take 1 tablet by mouth every 6 (six) hours as needed for severe pain.     rizatriptan 10 MG tablet  Commonly known as:  MAXALT  Take 10 mg by mouth every 2 (two) hours as needed for migraine (Max of 3 tablets in 24 hours.). May repeat in 2 hours if needed     SUPER B COMPLEX PO  Take by mouth.     SYSTANE OP  Place 1 drop into both eyes daily as needed (for dry eyes).     topiramate 25 MG tablet  Commonly known as:  TOPAMAX  Take 25 mg  by mouth 3 (three) times daily.     VITAMIN D-3 PO  Take 1 tablet by mouth daily.           Follow-up Information   Follow up with Woodfin Kiss L, MD In 3 weeks. (call office to make an appointment)    Specialty:  Neurosurgery   Contact information:   [address] [place] [address] [phone]       Signed: Lajeana Strough L 04/30/2014, 8:02 PM

## 2014-04-30 NOTE — H&P (Signed)
BP 151/92  Pulse 80  Temp(Src) 97.6 F (36.4 C) (Oral)  Resp 20  Wt 80.74 kg (178 lb)  SpO2 100% [name] is admitted due to cervical stenosis and radiculopathy. Allergies  Allergen Reactions  . Bee Venom Anaphylaxis    And other insects stings  . Other     NO BLOOD OR BLOOD PRODUCTS  . Sulfa Antibiotics     Passed out light headed  . Latex Itching and Rash   Past Surgical History  Procedure Laterality Date  . Cysts removed  1979 x2 & 1982    Bartholin-from walls of vagina x3  . Tonsillectomy  1960  . Breast surgery Bilateral 1993    Augmentation  . Dilation and curettage of uterus  1994  . Eye surgery Right 1995    Laser-retinal vein occlusion  . Colonoscopy     Past Medical History  Diagnosis Date  . Complication of anesthesia     Hay fever after anesthesia  . Headache(784.0)     Migraines, takes Topamax & Baclofen as needed  . Seasonal allergies     "year round"  . Recurrent URI (upper respiratory infection)   . Bronchitis, allergic   . Pneumonia   . Shortness of breath   . Asthma     inhaler as needed  . Fibromyalgia   . Anemia     Hemoglobin C  . Depression     Takes Wellbutrin  . Anxiety     "I have had a panic attack but it was years ago"  . Sleep apnea     used cpap but stopped due to rash/skin issues from allergy to cpap mask  . Chest pain     March 2015 was last episode  . Hypertension     Takes Losartan  . Herpes   . MVP (mitral valve prolapse) 1980s  . Heart murmur     as a child  . Arthritis     osteoarthritis  . IBS (irritable bowel syndrome)   . DVT (deep venous thrombosis)    History reviewed. No pertinent family history. History   Social History  . Marital Status: Married    Spouse Name: N/A    Number of Children: N/A  . Years of Education: N/A   Occupational History  . Not on file.   Social History Main Topics  . Smoking status: Former Smoker    Quit date: 04/28/1992  . Smokeless tobacco: Never Used  .  Alcohol Use: Yes     Comment: social/occ  . Drug Use: Yes    Special: Marijuana     Comment: "tried it years ago"  . Sexual Activity: Not on file   Other Topics Concern  . Not on file   Social History Narrative  . No narrative on file   Prior to Admission medications   Medication Sig Start Date End Date Taking? Authorizing Provider  acyclovir (ZOVIRAX) 400 MG tablet Take 400 mg by mouth 2 (two) times daily.   Yes Historical Provider, MD  aspirin 325 MG EC tablet Take 325 mg by mouth daily.   Yes Historical Provider, MD  B Complex-C (SUPER B COMPLEX PO) Take by mouth.   Yes Historical Provider, MD  baclofen (LIORESAL) 10 MG tablet Take 10 mg by mouth 3 (three) times daily as needed (for migraines).   Yes Historical Provider, MD  buPROPion (WELLBUTRIN SR) 150 MG 12 hr tablet Take 150 mg by mouth 2 (two) times daily.   Yes  Historical Provider, MD  cetirizine (ZYRTEC) 10 MG tablet Take 10 mg by mouth daily.   Yes Historical Provider, MD  Cholecalciferol (VITAMIN D-3 PO) Take 1 tablet by mouth daily.   Yes Historical Provider, MD  cyclobenzaprine (FLEXERIL) 10 MG tablet Take 10 mg by mouth 2 (two) times daily as needed for muscle spasms.   Yes Historical Provider, MD  dextromethorphan-guaiFENesin (MUCINEX DM) 30-600 MG per 12 hr tablet Take 1 tablet by mouth daily.   Yes Historical Provider, MD  fluticasone (FLONASE) 50 MCG/ACT nasal spray Place 1 spray into both nostrils daily.   Yes Historical Provider, MD  losartan (COZAAR) 50 MG tablet Take 50 mg by mouth daily.   Yes Historical Provider, MD  naproxen (NAPROSYN) 500 MG tablet Take 500 mg by mouth 2 (two) times daily as needed for mild pain.   Yes Historical Provider, MD  rizatriptan (MAXALT) 10 MG tablet Take 10 mg by mouth every 2 (two) hours as needed for migraine (Max of 3 tablets in 24 hours.). May repeat in 2 hours if needed   Yes Historical Provider, MD  topiramate (TOPAMAX) 25 MG tablet Take 25 mg by mouth 3 (three) times daily.    Yes Historical Provider, MD  albuterol (PROVENTIL HFA;VENTOLIN HFA) 108 (90 BASE) MCG/ACT inhaler Inhale into the lungs every 6 (six) hours as needed for wheezing or shortness of breath.    Historical Provider, MD  Polyethyl Glycol-Propyl Glycol (SYSTANE OP) Place 1 drop into both eyes daily as needed (for dry eyes).    Historical Provider, MD   Physical Exam  Constitutional: She is oriented to person, place, and time. She appears well-developed and well-nourished.  HENT:  Head: Normocephalic and atraumatic.  Right Ear: External ear normal.  Left Ear: External ear normal.  Eyes: Conjunctivae and EOM are normal. Pupils are equal, round, and reactive to light.  Neck: Normal range of motion. Neck supple.  Cardiovascular: Normal rate, regular rhythm and normal heart sounds.   Pulmonary/Chest: Effort normal and breath sounds normal.  Abdominal: Soft.  Musculoskeletal: Normal range of motion.  Neurological: She is alert and oriented to person, place, and time. She has normal reflexes. She displays normal reflexes. No cranial nerve deficit. She exhibits normal muscle tone. Coordination normal.  Skin: Skin is warm and dry.   Assessment BP 151/92  Pulse 80  Temp(Src) 97.6 F (36.4 C) (Oral)  Resp 20  Wt 80.74 kg (178 lb)  SpO2 100% Jill Collins is a 58 y.o. female  has decided to undergo an anterior cervical decompression and arthrodesis for stenosis at levels 3/4,4/5,5/6. Risks and benefits including but not limited to bleeding, infection, paralysis, weakness in one or both extremities, bowel and/or bladder dysfunction, fusion failure, hardware failure, need for further surgery, no relief of pain. she understands and wishes to proceed.

## 2014-04-30 NOTE — Op Note (Signed)
04/30/2014  5:07 PM  PATIENT:  Jill Collins  58 y.o. female  PRE-OPERATIVE DIAGNOSIS:  cervical spondylosis with myelopathy C3-6  POST-OPERATIVE DIAGNOSIS:  CERVICAL SPONDYLOSIS WITH MYELOPATHY C3-6  PROCEDURE:  Anterior Cervical decompression C3/4,4/5,5/6 Arthrodesis C3-6 with 6mm structural allograft 3/4,67mm at 4/5, and 7mm at 5/6 Anterior instrumentation(Nuvasive) C3-6  SURGEON:  Surgeon(s): Coletta Memos, MD  ASSISTANTS:Elsner, Sherilyn Cooter  ANESTHESIA:   general  EBL:  Total I/O In: 2250 [I.V.:2000; IV Piggyback:250] Out: 775 [Urine:475; Blood:300]  BLOOD ADMINISTERED:none  CELL SAVER GIVEN:none  COUNT:per nursing  DRAINS: none   SPECIMEN:  No Specimen  DICTATION: Mrs. Supple was taken to the operating room, intubated, and placed under general anesthesia without difficulty. She was positioned supine with her head in slight extension on a horseshoe headrest. The neck was prepped and draped in a sterile manner. I infiltrated 3 cc's 1/2%lidocaine/1:200,000 strength epinephrine into the planned incision starting from the midline to the medial border of the left sternocleidomastoid muscle. I opened the incision with a 10 blade and dissected sharply through soft tissue to the platysma. I dissected in the plane superior to the platysma both rostrally and caudally. I then opened the platysma in a horizontal fashion with Metzenbaum scissors, and dissected in the inferior plane rostrally and caudally. With both blunt and sharp technique I created an avascular corridor to the cervical spine. I placed a spinal needle(s) in the disc space at 4/5 . I then reflected the longus colli from C3 to C6 and placed self retaining retractors. I opened the disc space(s) at 3/4,4/5,5/6 with a 15 blade. I removed disc with curettes, Kerrison punches, and the drill. Using the drill I removed osteophytes and prepared for the decompression.  We decompressed the spinal canal and the C4,5,and 6 root(s) with the  drill, Kerrison punches, and the curettes. I used the microscope to aid in microdissection. I removed the posterior longitudinal ligament to fully expose and decompress the thecal sac. I exposed the roots laterally taking down the 3/4,4/5,and the 5/6 uncovertebral joints.  The posterior longitudinal ligament was quite adherent to the dura at C4/5, and 5/6. I worked for some time dissecting the ligament off the dural surface to the extent that I could. With the decompression complete I moved on to the arthrodesis. I used the drill to level the surfaces of C3/4,4/5,and 5/6. I removed soft tissue to prepare the disc space and the bony surfaces. I measured the spaces and placed a 7mm structural allograft into the disc space at 5/6, 5mm at 4/5, and 6mm at 3/4.  I then placed the anterior instrumentation. I placed 2 screws in each vertebral body through the plate. I locked the screws into place. Intraoperative xray showed the graft, plate, and screws to be in good position. I irrigated the wound, achieved hemostasis, and closed the wound in layers. I approximated the platysma, and the subcuticular plane with vicryl sutures. I used Dermabond for a sterile dressing.   PLAN OF CARE: Admit to inpatient   PATIENT DISPOSITION:  PACU - hemodynamically stable.   Delay start of Pharmacological VTE agent (>24hrs) due to surgical blood loss or risk of bleeding:  yes

## 2014-04-30 NOTE — Discharge Instructions (Signed)

## 2014-04-30 NOTE — Anesthesia Postprocedure Evaluation (Signed)
Anesthesia Post Note  Patient: Jill Collins  Procedure(s) Performed: Procedure(s) (LRB): Cervical three/four, four/five, five/six  anterior cervical decompression with fusion plating and bonegraft (N/A)  Anesthesia type: general  Patient location: PACU  Post pain: Pain level controlled  Post assessment: Patient's Cardiovascular Status Stable  Last Vitals:  Filed Vitals:   04/30/14 1545  BP:   Pulse: 88  Temp:   Resp: 12    Post vital signs: Reviewed and stable  Level of consciousness: sedated  Complications: No apparent anesthesia complications

## 2014-04-30 NOTE — Transfer of Care (Signed)
Immediate Anesthesia Transfer of Care Note  Patient: Jill Collins  Procedure(s) Performed: Procedure(s): Cervical three/four, four/five, five/six  anterior cervical decompression with fusion plating and bonegraft (N/A)  Patient Location: PACU  Anesthesia Type:General  Level of Consciousness: sedated  Airway & Oxygen Therapy: Patient Spontanous Breathing and Patient connected to nasal cannula oxygen  Post-op Assessment: Report given to PACU RN, Post -op Vital signs reviewed and stable and Patient moving all extremities  Post vital signs: Reviewed and stable  Complications: No apparent anesthesia complications

## 2014-04-30 NOTE — Anesthesia Procedure Notes (Signed)
Procedure Name: Intubation Date/Time: 04/30/2014 8:10 AM Performed by: Trixie Deis A Pre-anesthesia Checklist: Patient identified, Timeout performed, Emergency Drugs available, Suction available and Patient being monitored Patient Re-evaluated:Patient Re-evaluated prior to inductionOxygen Delivery Method: Circle system utilized Preoxygenation: Pre-oxygenation with 100% oxygen Intubation Type: IV induction Ventilation: Mask ventilation without difficulty and Oral airway inserted - appropriate to patient size Grade View: Grade I Tube type: Oral Tube size: 7.0 mm Number of attempts: 1 Airway Equipment and Method: Rigid stylet,  LTA kit utilized and Video-laryngoscopy Placement Confirmation: ETT inserted through vocal cords under direct vision,  breath sounds checked- equal and bilateral and positive ETCO2 Secured at: 21 cm Tube secured with: Tape Dental Injury: Teeth and Oropharynx as per pre-operative assessment  Comments: glidescope used due to myelopathy

## 2014-05-01 DIAGNOSIS — M4712 Other spondylosis with myelopathy, cervical region: Secondary | ICD-10-CM | POA: Diagnosis not present

## 2014-05-01 MED ORDER — TOPIRAMATE 25 MG PO TABS
25.0000 mg | ORAL_TABLET | Freq: Every morning | ORAL | Status: DC
  Administered 2014-05-02: 25 mg via ORAL
  Filled 2014-05-01: qty 1

## 2014-05-01 NOTE — Progress Notes (Signed)
UR completed 

## 2014-05-01 NOTE — Progress Notes (Signed)
Patient ID: Jill Collins, female   DOB: [birth year], 57 y.o.   MRN: [id number] Patient doing well significant improvement and arm pain still some numbness in the right arm no worse.  Incision clean dry and intact  Discharge home later patient ambulates well.

## 2014-05-01 NOTE — Discharge Summary (Signed)
Physician Discharge Summary  Patient ID: Jill Collins MRN: [id number] DOB/AGE: 57-28-1957 57 y.o.  Admit date: 04/30/2014 Discharge date: 05/01/2014  Admission Diagnoses:cervical spondylosis and stenosis  Discharge Diagnoses: same Active Problems:   Cervical spondylosis   Discharged Condition: good  Hospital Course: patient admitted hospital underwent anterior cervical discectomies and fusion postoperatively patient did very well went to the floor was angling and voiding spontaneously and was working with physical therapy and the patient is cleared by physical therapy will be discharged on postoperative day 1.  Consults: Significant Diagnostic Studies Treatments:ACDF C3-4 C4-5 C5-6 Discharge Exam: Blood pressure 143/94, pulse 91, temperature 98 F (36.7 C), temperature source Oral, resp. rate 18, weight 80.74 kg (178 lb), SpO2 99.00%. Strength out of 5 wound clean dry and intact  Disposition: home     Medication List         acyclovir 400 MG tablet  Commonly known as:  ZOVIRAX  Take 400 mg by mouth 2 (two) times daily.     albuterol 108 (90 BASE) MCG/ACT inhaler  Commonly known as:  PROVENTIL HFA;VENTOLIN HFA  Inhale into the lungs every 6 (six) hours as needed for wheezing or shortness of breath.     aspirin 325 MG EC tablet  Take 325 mg by mouth daily.     baclofen 10 MG tablet  Commonly known as:  LIORESAL  Take 10 mg by mouth 3 (three) times daily as needed (for migraines).     buPROPion 150 MG 12 hr tablet  Commonly known as:  WELLBUTRIN SR  Take 150 mg by mouth 2 (two) times daily.     cetirizine 10 MG tablet  Commonly known as:  ZYRTEC  Take 10 mg by mouth daily.     cyclobenzaprine 10 MG tablet  Commonly known as:  FLEXERIL  Take 10 mg by mouth 2 (two) times daily as needed for muscle spasms.     cyclobenzaprine 10 MG tablet  Commonly known as:  FLEXERIL  Take 1 tablet (10 mg total) by mouth 3 (three) times daily as needed for muscle spasms.     dextromethorphan-guaiFENesin 30-600 MG per 12 hr tablet  Commonly known as:  MUCINEX DM  Take 1 tablet by mouth daily.     fluticasone 50 MCG/ACT nasal spray  Commonly known as:  FLONASE  Place 1 spray into both nostrils daily.     losartan 50 MG tablet  Commonly known as:  COZAAR  Take 50 mg by mouth daily.     naproxen 500 MG tablet  Commonly known as:  NAPROSYN  Take 500 mg by mouth 2 (two) times daily as needed for mild pain.     oxyCODONE-acetaminophen 5-325 MG per tablet  Commonly known as:  ROXICET  Take 1 tablet by mouth every 6 (six) hours as needed for severe pain.     rizatriptan 10 MG tablet  Commonly known as:  MAXALT  Take 10 mg by mouth every 2 (two) hours as needed for migraine (Max of 3 tablets in 24 hours.). May repeat in 2 hours if needed     SUPER B COMPLEX PO  Take by mouth.     SYSTANE OP  Place 1 drop into both eyes daily as needed (for dry eyes).     topiramate 25 MG tablet  Commonly known as:  TOPAMAX  Take 25 mg by mouth 3 (three) times daily.     VITAMIN D-3 PO  Take 1 tablet by mouth daily.  Follow-up Information   Follow up with CABBELL,KYLE L, MD In 3 weeks. (call office to make an appointment)    Specialty:  Neurosurgery   Contact information:   [address] [place] [address] [phone]       Follow up with Carmela Hurt, MD.   Specialty:  Neurosurgery   Contact information:   [address] [place] [address] [phone]       Signed: Mariam Dollar 05/01/2014, 8:44 AM

## 2014-05-01 NOTE — Evaluation (Signed)
Occupational Therapy Evaluation Patient Details Name: Jill Collins MRN: [id number] DOB: 1956-02-12 Today's Date: 05/01/2014    History of Present Illness 58 y.o. s/p Cervical three/four, four/five, five/six  anterior cervical decompression with fusion plating and bonegraft.   Clinical Impression   Pt s/p above. Pt requiring assist at times with ADLs, PTA. Feel pt will benefit from acute OT to increase independence prior to d/c.    Follow Up Recommendations  No OT follow up;Supervision - Intermittent    Equipment Recommendations  3 in 1 bedside comode    Recommendations for Other Services       Precautions / Restrictions Precautions Precautions: Cervical Required Braces or Orthoses:  (pt reports she is getting soft collar) Restrictions Weight Bearing Restrictions: No      Mobility Bed Mobility Overal bed mobility: Needs Assistance Bed Mobility: Rolling;Sidelying to Sit;Sit to Sidelying Rolling: Min guard;Supervision Sidelying to sit: Supervision     Sit to sidelying: Min guard General bed mobility comments: cues for technique.  Transfers Overall transfer level: Needs assistance   Transfers: Sit to/from Stand Sit to Stand: Min guard         General transfer comment: cues for technique.    Balance                                            ADL Overall ADL's : Needs assistance/impaired     Grooming: Oral care;Set up;Supervision/safety;Standing               Lower Body Dressing: Sit to/from stand;Minimal assistance   Toilet Transfer: Min guard;Ambulation;Regular Toilet;Grab bars (also practiced 3 in 1 over commode)   Toileting- Clothing Manipulation and Hygiene: Supervision/safety;Sit to/from stand       Functional mobility during ADLs: Minimal assistance General ADL Comments: Explained technique for LB ADLs-pt able to cross legs over knees. Recommended sitting for LB bathing. Educated on use of cup for teeth care and use of  straw. Discussed 3 in 1. Educated on activities pt can be doing for fine motor coordination in right hand and gave handout. Discussed safety concern with RUE and avoid sharp/hot objects/surfaces due to this. Discussed use of button up shirts/shirts for UB dressing and also educated on dressing technique. Told pt there is jar opener available as she reports this is difficult at home.     Vision                     Perception     Praxis      Pertinent Vitals/Pain Pain Assessment: 0-10 Pain Score: 8  Pain Location: neck and headache Pain Intervention(s): Repositioned     Hand Dominance Right   Extremity/Trunk Assessment Upper Extremity Assessment Upper Extremity Assessment: RUE deficits/detail;LUE deficits/detail RUE Deficits / Details: grip strength slightly weaker than left hand RUE Sensation: decreased light touch RUE Coordination: decreased fine motor LUE Sensation:  (reports pain)   Lower Extremity Assessment Lower Extremity Assessment: Defer to PT evaluation       Communication Communication Communication: No difficulties   Cognition Arousal/Alertness: Awake/alert Behavior During Therapy: WFL for tasks assessed/performed Overall Cognitive Status: Within Functional Limits for tasks assessed                     General Comments       Exercises       Shoulder Instructions  Home Living Family/patient expects to be discharged to:: Private residence Living Arrangements: Spouse/significant other;Children Available Help at Discharge: Family;Available 24 hours/day Type of Home: House Home Access: Stairs to enter [employer] of Steps: 3-4 Entrance Stairs-Rails: Right;Left;Can reach both Home Layout: One level     Bathroom Shower/Tub: Tub/shower unit;Walk-in Human resources officer: Standard     Home Equipment: Shower seat;Shower seat - built in          Prior Functioning/Environment Level of Independence: Needs assistance     ADL's / Homemaking Assistance Needed: assist with UB bathing/dressing at times        OT Diagnosis: Acute pain   OT Problem List: Decreased strength;Impaired balance (sitting and/or standing);Decreased coordination;Decreased knowledge of use of DME or AE;Decreased knowledge of precautions;Pain;Impaired sensation   OT Treatment/Interventions: Self-care/ADL training;DME and/or AE instruction;Therapeutic activities;Patient/family education;Balance training;Therapeutic exercise    OT Goals(Current goals can be found in the care plan section) Acute Rehab OT Goals Patient Stated Goal: not stated OT Goal Formulation: With patient Time For Goal Achievement: 05/08/14 Potential to Achieve Goals: Good ADL Goals Pt Will Perform Upper Body Dressing: with modified independence;sitting Pt Will Transfer to Toilet: with modified independence;ambulating (3 in 1 over commode) Additional ADL Goal #1: Pt wil independently perform HEP for RUE to increase strength and coordination.  OT Frequency: Min 2X/week   Barriers to D/C:            Co-evaluation              End of Session Equipment Utilized During Treatment: Gait belt Nurse Communication: Mobility status  Activity Tolerance: Patient tolerated treatment well Patient left: in bed;with call bell/phone within reach;with family/visitor present   Time: 1610-9604 OT Time Calculation (min): 30 min Charges:  OT General Charges $OT Visit: 1 Procedure OT Evaluation $Initial OT Evaluation Tier I: 1 Procedure OT Treatments $Self Care/Home Management : 8-22 mins G-Codes: OT G-codes **NOT FOR INPATIENT CLASS** Functional Assessment Tool Used: clinical judgment Functional Limitation: Self care Self Care Current Status (V4098): At least 20 percent but less than 40 percent impaired, limited or restricted Self Care Goal Status (J1914): 0 percent impaired, limited or restricted  Earlie Raveling OTR/L [phone] 05/01/2014, 9:27 AM

## 2014-05-01 NOTE — Evaluation (Signed)
Physical Therapy Evaluation Patient Details Name: Jill Collins MRN: [id number] DOB: [birth year] Today's Date: 05/01/2014   History of Present Illness  58 y.o. s/p Cervical three/four, four/five, five/six  anterior cervical decompression with fusion plating and bonegraft.  Clinical Impression  Patient is s/p above surgery resulting in the deficits listed below (see PT Problem List).  Patient will benefit from skilled PT to increase their independence and safety with mobility (while adhering to their precautions) to allow discharge home with family. Pt at this time to benefit from cane for mobility. Pt c/o intermittent Rt knee instability that results in falls.      Follow Up Recommendations No PT follow up;Supervision/Assistance - 24 hour    Equipment Recommendations  Cane    Recommendations for Other Services OT consult     Precautions / Restrictions Precautions Precautions: Cervical Precaution Comments: reviewed cervical precautions and handout with pt and husband Required Braces or Orthoses: Other Brace/Splint Other Brace/Splint: cervical soft collar ordered for comfort Restrictions Weight Bearing Restrictions: No      Mobility  Bed Mobility Overal bed mobility: Needs Assistance Bed Mobility: Rolling;Sidelying to Sit Rolling: Supervision Sidelying to sit: Supervision     Sit to sidelying: Min guard General bed mobility comments: min cues for log rolling technique; pt guarded due to pain  Transfers Overall transfer level: Needs assistance Equipment used: 1 person hand held assist Transfers: Sit to/from Stand Sit to Stand: Min guard;Supervision         General transfer comment: min guard initiall and reaching for UE support; from toilet pt supervision for min cues for safety  Ambulation/Gait Ambulation/Gait assistance: Min guard;Supervision Ambulation Distance (Feet): 200 Feet Assistive device:  (IV pole) Gait Pattern/deviations: Step-through  pattern;Shuffle;Narrow base of support Gait velocity: guarded due to pain Gait velocity interpretation: Below normal speed for age/gender General Gait Details: pt guarded and initially requiring min guard for safety with mobility; progresssing towards supervision with IV pole; may need cane for D/C   Stairs Stairs: Yes Stairs assistance: Supervision Stair Management: One rail Right;Step to pattern;Forwards Number of Stairs: 5 General stair comments: demo good safety awareness; cues for safe technique  Wheelchair Mobility    Modified Rankin (Stroke Patients Only)       Balance Overall balance assessment: Needs assistance         Standing balance support: Single extremity supported;During functional activity Standing balance-Leahy Scale: Poor Standing balance comment: UE support to balance             High level balance activites: Direction changes High Level Balance Comments: UE support to balance             Pertinent Vitals/Pain Pain Assessment: 0-10 Pain Score: 5  Pain Location: shoulders Pain Descriptors / Indicators: Aching Pain Intervention(s): Monitored during session;Premedicated before session    Home Living Family/patient expects to be discharged to:: Private residence Living Arrangements: Spouse/significant other;Children Available Help at Discharge: Family;Available 24 hours/day Type of Home: House Home Access: Stairs to enter Entrance Stairs-Rails: Right;Left;Can reach both Entrance Stairs-Number of Steps: 3-4 Home Layout: One level Home Equipment: Shower seat;Shower seat - built in      Prior Function Level of Independence: Independent      ADL's / Homemaking Assistance Needed: assist with UB bathing/dressing at times        Hand Dominance   Dominant Hand: Right    Extremity/Trunk Assessment   Upper Extremity Assessment: Defer to OT evaluation RUE Deficits / Details: grip strength slightly weaker than left hand  RUE  Sensation: decreased light touch     Lower Extremity Assessment: Generalized weakness      Cervical / Trunk Assessment: Normal  Communication   Communication: No difficulties  Cognition Arousal/Alertness: Awake/alert Behavior During Therapy: WFL for tasks assessed/performed Overall Cognitive Status: Within Functional Limits for tasks assessed       Memory: Decreased recall of precautions              General Comments      Exercises        Assessment/Plan    PT Assessment Patient needs continued PT services  PT Diagnosis Difficulty walking;Acute pain   PT Problem List Decreased strength;Decreased balance;Decreased activity tolerance;Decreased mobility;Pain;Decreased knowledge of precautions  PT Treatment Interventions DME instruction;Gait training;Stair training;Functional mobility training;Therapeutic activities;Therapeutic exercise;Balance training;Neuromuscular re-education;Patient/family education   PT Goals (Current goals can be found in the Care Plan section) Acute Rehab PT Goals Patient Stated Goal: to go home when im ready PT Goal Formulation: With patient Time For Goal Achievement: 05/03/14 Potential to Achieve Goals: Good    Frequency Min 5X/week   Barriers to discharge        Co-evaluation               End of Session Equipment Utilized During Treatment: Gait belt Activity Tolerance: Patient tolerated treatment well Patient left: in bed;with call bell/phone within reach;with family/visitor present Nurse Communication: Mobility status    Functional Assessment Tool Used: clinical judgement Functional Limitation: Mobility: Walking and moving around Mobility: Walking and Moving Around Current Status [phone]): At least 1 percent but less than 20 percent impaired, limited or restricted Mobility: Walking and Moving Around Goal Status [phone]): 0 percent impaired, limited or restricted    Time: 0950-1023 PT Time Calculation (min): 33  min   Charges:   PT Evaluation $Initial PT Evaluation Tier I: 1 Procedure PT Treatments $Gait Training: 23-37 mins   PT G Codes:   Functional Assessment Tool Used: clinical judgement Functional Limitation: Mobility: Walking and moving around    [place], [place], [place]  [phone] 05/01/2014, 11:52 AM

## 2014-05-02 DIAGNOSIS — M4712 Other spondylosis with myelopathy, cervical region: Secondary | ICD-10-CM | POA: Diagnosis not present

## 2014-05-02 NOTE — Progress Notes (Signed)
Physical Therapy Treatment Patient Details Name: Jill Collins MRN: [id number] DOB: 06/27/1956 Today's Date: 05/02/2014    History of Present Illness 58 y.o. s/p Cervical three/four, four/five, five/six  anterior cervical decompression with fusion plating and bonegraft.    PT Comments    Pt more stable with cane today when ambulating. Reviewed cervical precautions thoroughly with pt and husband.   Follow Up Recommendations  No PT follow up;Supervision/Assistance - 24 hour     Equipment Recommendations  Cane    Recommendations for Other Services       Precautions / Restrictions Precautions Precautions: Cervical Precaution Comments: pt given new handout because they had lost her first one; reviewed precatuions  Required Braces or Orthoses: Other Brace/Splint;Cervical Brace Cervical Brace: Soft collar;For comfort Restrictions Weight Bearing Restrictions: No    Mobility  Bed Mobility               General bed mobility comments: pt up in bathroom  Transfers Overall transfer level: Modified independent               General transfer comment: no (A) needed   Ambulation/Gait Ambulation/Gait assistance: Supervision Ambulation Distance (Feet): 220 Feet Assistive device: Straight cane Gait Pattern/deviations: Step-through pattern;Decreased stride length;Wide base of support Gait velocity: guarded due to pain Gait velocity interpretation: Below normal speed for age/gender General Gait Details: educated on technique and safety with cane;pt reports feeling more stable with cane; no LOB noted; supervision for safety   Stairs Stairs:  (reviewed technique verbally)          Wheelchair Mobility    Modified Rankin (Stroke Patients Only)       Balance           Standing balance support: During functional activity;No upper extremity supported Standing balance-Leahy Scale: Fair                      Cognition Arousal/Alertness:  Awake/alert Behavior During Therapy: WFL for tasks assessed/performed Overall Cognitive Status: Within Functional Limits for tasks assessed       Memory: Decreased recall of precautions              Exercises      General Comments General comments (skin integrity, edema, etc.): reviewed home setup safety questions and car transfer questions       Pertinent Vitals/Pain Pain Assessment: 0-10 Pain Score: 4  Pain Location: shoulders Pain Descriptors / Indicators: Aching Pain Intervention(s): Monitored during session;Premedicated before session    Home Living                      Prior Function            PT Goals (current goals can now be found in the care plan section) Acute Rehab PT Goals Patient Stated Goal: to go home today PT Goal Formulation: With patient Time For Goal Achievement: 05/03/14 Potential to Achieve Goals: Good Progress towards PT goals: Goals met/education completed, patient discharged from PT    Frequency  Min 5X/week    PT Plan Current plan remains appropriate    Co-evaluation             End of Session Equipment Utilized During Treatment: Gait belt Activity Tolerance: Patient tolerated treatment well Patient left: Other (comment) (up in room with husband )     Time: 0911-0926 PT Time Calculation (min): 15 min  Charges:  $Gait Training: 8-22 mins  G Codes:  Functional Assessment Tool Used: clinical judgement Functional Limitation: Mobility: Walking and moving around Mobility: Walking and Moving Around Current Status [phone]): At least 1 percent but less than 20 percent impaired, limited or restricted Mobility: Walking and Moving Around Goal Status [phone]): 0 percent impaired, limited or restricted Mobility: Walking and Moving Around Discharge Status [phone]): At least 1 percent but less than 20 percent impaired, limited or restricted   Gustavus Bryant, [place]  [phone] 05/02/2014, 10:43 AM

## 2014-05-02 NOTE — Progress Notes (Signed)
Utilization Review Completed.   Prachi Oftedahl, RN, BSN Nurse Case Manager  

## 2014-05-02 NOTE — Discharge Summary (Signed)
Physician Discharge Summary  Patient ID: Jill Collins MRN: [id number] DOB/AGE: Aug 26, 1955 58 y.o.  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 She tolerated surgery well. She is discharged home at this time  Consults: None  Significant Diagnostic Studies: None  Treatments: surgery: Anterior cervical decompression arthrodesis C3-4 C4-5 C5-6  Discharge Exam: Blood pressure 134/82, pulse 92, temperature 98.7 F (37.1 C), temperature source Oral, resp. rate 20, height  (1.626 m), weight 80.74 kg (178 lb), SpO2 94.00%. Incision is clean and dry and neck rotation is intact at 45 to either side motor function reveals mild weakness in the grip strength on the right side at 4+ out of 5 intrinsic strength is normal shoulder strength is 4+ out of 5 in the deltoid on the right area  Disposition: Discharge home  Discharge Instructions   Diet - low sodium heart healthy    Complete by:  As directed      Increase activity slowly    Complete by:  As directed             Medication List         acyclovir 400 MG tablet  Commonly known as:  ZOVIRAX  Take 400 mg by mouth 2 (two) times daily.     albuterol 108 (90 BASE) MCG/ACT inhaler  Commonly known as:  PROVENTIL HFA;VENTOLIN HFA  Inhale into the lungs every 6 (six) hours as needed for wheezing or shortness of breath.     aspirin 325 MG EC tablet  Take 325 mg by mouth daily.     baclofen 10 MG tablet  Commonly known as:  LIORESAL  Take 10 mg by mouth 3 (three) times daily as needed (for migraines).     buPROPion 150 MG 12 hr tablet   Commonly known as:  WELLBUTRIN SR  Take 150 mg by mouth 2 (two) times daily.     cetirizine 10 MG tablet  Commonly known as:  ZYRTEC  Take 10 mg by mouth daily.     cyclobenzaprine 10 MG tablet  Commonly known as:  FLEXERIL  Take 10 mg by mouth 2 (two) times daily as needed for muscle spasms.     cyclobenzaprine 10 MG tablet  Commonly known as:  FLEXERIL  Take 1 tablet (10 mg total) by mouth 3 (three) times daily as needed for muscle spasms.     dextromethorphan-guaiFENesin 30-600 MG per 12 hr tablet  Commonly known as:  MUCINEX DM  Take 1 tablet by mouth daily.     fluticasone 50 MCG/ACT nasal spray  Commonly known as:  FLONASE  Place 1 spray into both nostrils daily.     losartan 50 MG tablet  Commonly known as:  COZAAR  Take 50 mg by mouth daily.     naproxen 500 MG tablet  Commonly known as:  NAPROSYN  Take 500 mg by mouth 2 (two) times daily as needed for mild pain.     oxyCODONE-acetaminophen 5-325 MG per tablet  Commonly known as:  ROXICET  Take 1 tablet by mouth every 6 (six) hours as needed for severe pain.     rizatriptan 10 MG tablet  Commonly known as:  MAXALT  Take  10 mg by mouth every 2 (two) hours as needed for migraine (Max of 3 tablets in 24 hours.). May repeat in 2 hours if needed     SUPER B COMPLEX PO  Take by mouth.     SYSTANE OP  Place 1 drop into both eyes daily as needed (for dry eyes).     topiramate 25 MG tablet  Commonly known as:  TOPAMAX  Take 25 mg by mouth 3 (three) times daily.     VITAMIN D-3 PO  Take 1 tablet by mouth daily.           Follow-up Information   Follow up with CABBELL,KYLE L, MD In 3 weeks. (call office to make an appointment)    Specialty:  Neurosurgery   Contact information:   [address] [place] [address] [phone]       Follow up with Carmela Hurt, MD.   Specialty:  Neurosurgery   Contact information:   [address] [place] [address] [phone]        Signed: Stefani Dama 05/02/2014, 9:01 AM

## 2014-05-02 NOTE — Progress Notes (Signed)
Ready for discharge home; discharge instructions given and reviewed; 3n1 commode chair delivered to patient; Rx's given to patient; patient discharged accompanied by her husband.

## 2014-05-04 ENCOUNTER — Encounter (HOSPITAL_COMMUNITY): Payer: Self-pay | Admitting: Neurosurgery

## 2014-05-18 ENCOUNTER — Ambulatory Visit (INDEPENDENT_AMBULATORY_CARE_PROVIDER_SITE_OTHER): Payer: Medicaid Other

## 2014-05-18 ENCOUNTER — Other Ambulatory Visit: Payer: Self-pay | Admitting: Neurosurgery

## 2014-05-18 DIAGNOSIS — M5032 Other cervical disc degeneration, mid-cervical region: Secondary | ICD-10-CM

## 2014-05-18 DIAGNOSIS — M4712 Other spondylosis with myelopathy, cervical region: Secondary | ICD-10-CM

## 2015-08-24 IMAGING — CR DG CERVICAL SPINE COMPLETE 4+V
1 series · 1 of 1 positions shown · non-contrast
Comparison: 11/09/2013

CLINICAL DATA: Cervical fusion at C3 through 6

EXAM:
CERVICAL SPINE  4+ VIEWS

[lat]
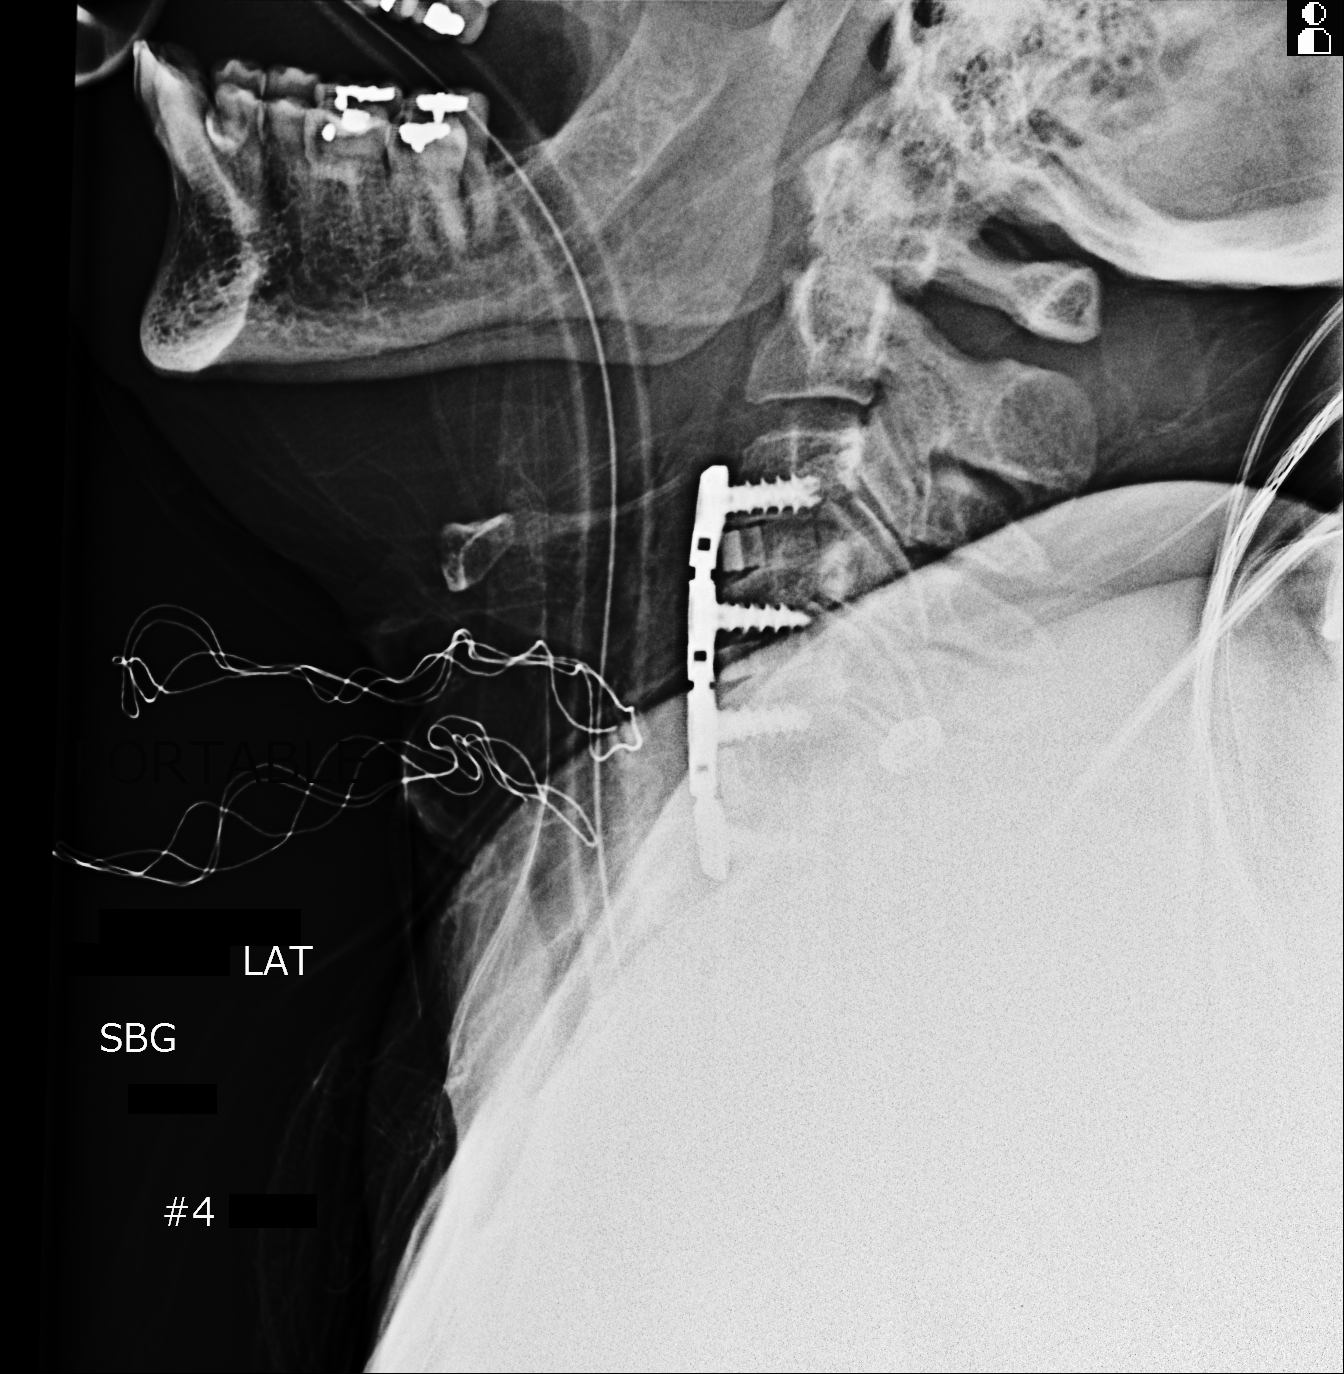

[1 of 1 positions shown; findings below may reference images not displayed]

FINDINGS: Multiple spot films were obtained of the cervical spine during
intraoperative fusion. The initial image obtained at 844 hr shows a
surgical instrument anterior to the soft tissues of the neck at the
C4-5 level.

A subsequent film obtained at 902 hr shows a needle within the
anterior aspect of the disc space at C4-5.

The subsequent film obtained at 925 hr shows a surgical instrument
within the disc space at C4-5 with surgical retractors in place.

The fourth film obtained at 4444 hr shows evidence of interbody
fusion at C3-4, 4-5 and 5-6 with anterior fixation.
IMPRESSION: Intraoperative localization during cervical fusion

## 2017-02-13 ENCOUNTER — Other Ambulatory Visit: Payer: Self-pay | Admitting: Neurosurgery

## 2017-02-13 DIAGNOSIS — M4712 Other spondylosis with myelopathy, cervical region: Secondary | ICD-10-CM

## 2019-09-03 ENCOUNTER — Other Ambulatory Visit: Payer: Self-pay | Admitting: Neurosurgery

## 2019-09-03 DIAGNOSIS — M4712 Other spondylosis with myelopathy, cervical region: Secondary | ICD-10-CM

## 2019-09-03 DIAGNOSIS — G35 Multiple sclerosis: Secondary | ICD-10-CM

## 2019-09-14 ENCOUNTER — Other Ambulatory Visit: Payer: Self-pay

## 2019-09-21 ENCOUNTER — Ambulatory Visit (INDEPENDENT_AMBULATORY_CARE_PROVIDER_SITE_OTHER): Payer: Medicare Other

## 2019-09-21 ENCOUNTER — Other Ambulatory Visit: Payer: Self-pay

## 2019-09-21 DIAGNOSIS — G35 Multiple sclerosis: Secondary | ICD-10-CM | POA: Diagnosis not present

## 2019-09-21 DIAGNOSIS — M4712 Other spondylosis with myelopathy, cervical region: Secondary | ICD-10-CM

## 2021-01-14 IMAGING — MR MR CERVICAL SPINE W/O CM
5 series · 40 of 48 positions shown · non-contrast
Comparison: Outside MRI 2269

CLINICAL DATA: Numbness, pain, tingling in right fourth and fifth
fingers; history of fusion

EXAM:
MRI CERVICAL SPINE WITHOUT CONTRAST
TECHNIQUE: Multiplanar, multisequence MR imaging of the cervical spine was
performed. No intravenous contrast was administered.

[Series 2: T2 · sagittal · 3.0mm · 0.69mm/px · 6 of 13 slices shown (1 of 2)]
[im 1/13]
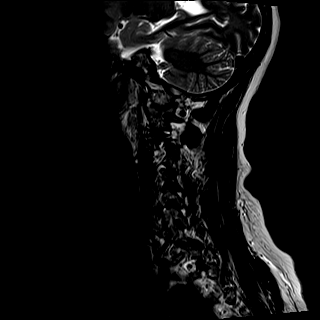
[im 3/13]
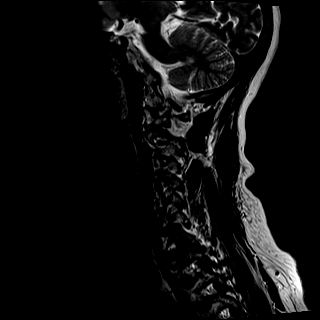
[im 5/13]
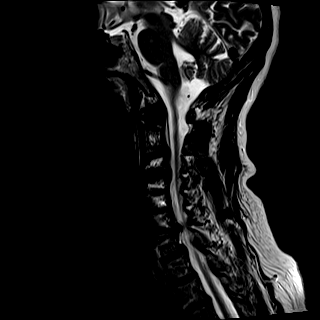
[im 8/13]
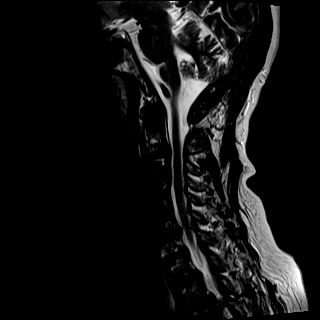
[im 10/13]
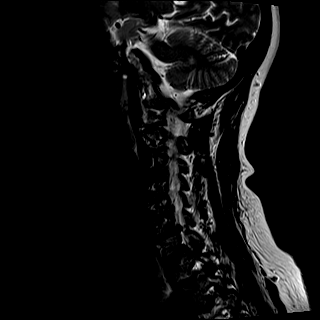
[im 13/13]
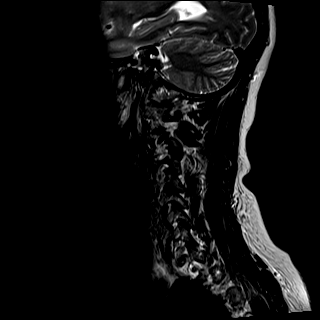

[Series 3: T1 · sagittal · 3.0mm · 0.86mm/px · 7 of 13 slices shown]
[im 1/13]
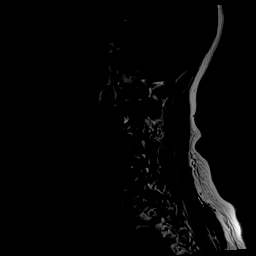
[im 3/13]
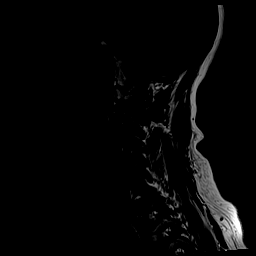
[im 5/13]
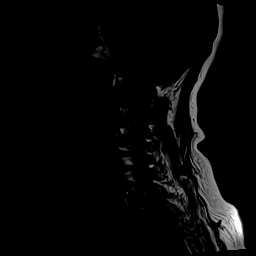
[im 7/13]
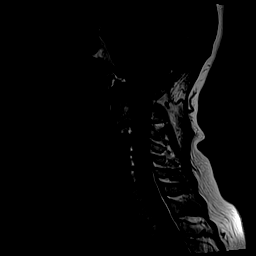
[im 9/13]
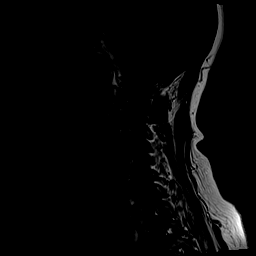
[im 11/13]
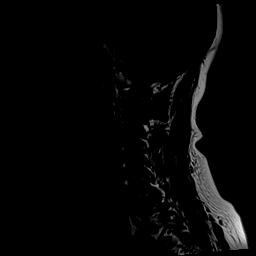
[im 13/13]
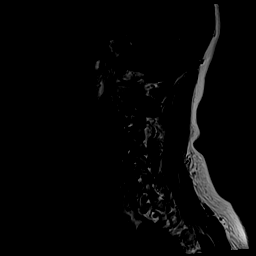

[Series 4: STIR · sagittal · 3.0mm · 0.69mm/px · 7 of 13 slices shown]
[im 1/13]
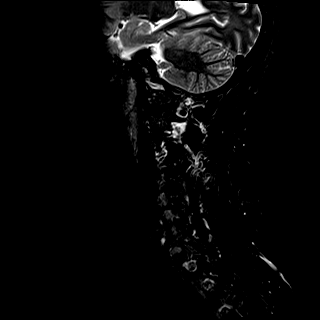
[im 3/13]
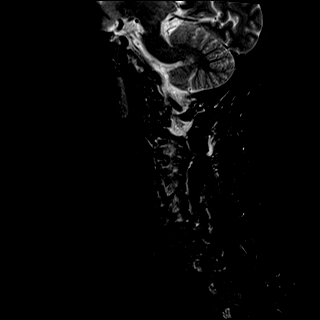
[im 5/13]
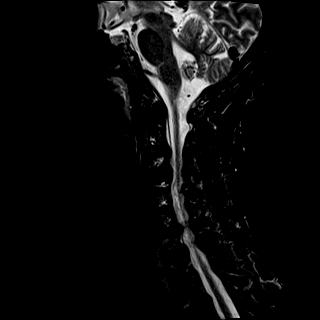
[im 7/13]
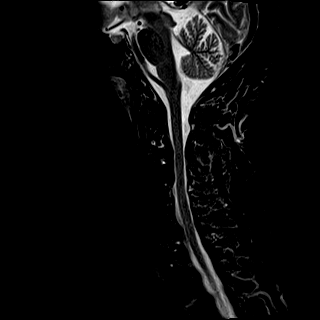
[im 9/13]
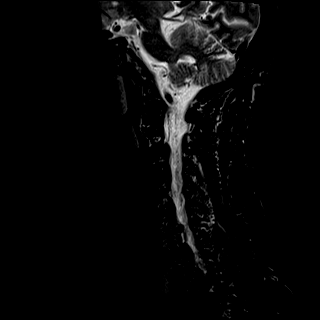
[im 11/13]
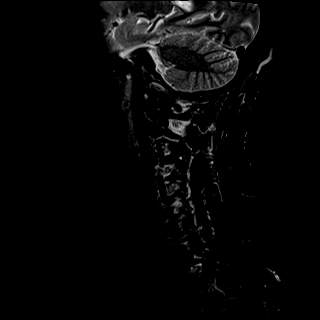
[im 13/13]
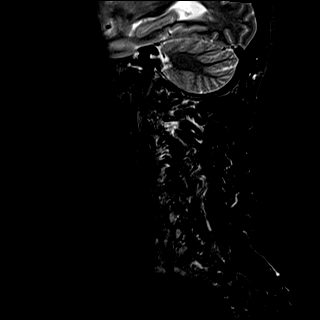

[Series 5: T2 · axial · 3.0mm · 0.62mm/px · z∈[-196,-99]mm · 12 of 27 slices shown (2 of 2)]
[im 1/27]
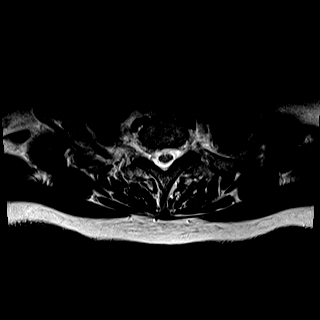
[im 3/27]
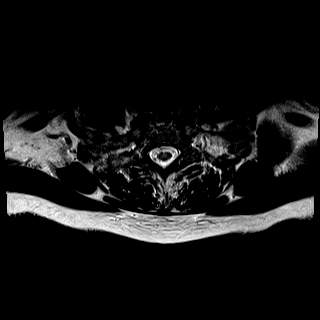
[im 5/27]
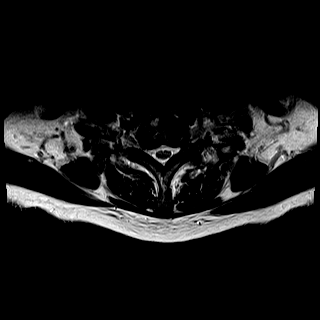
[im 7/27]
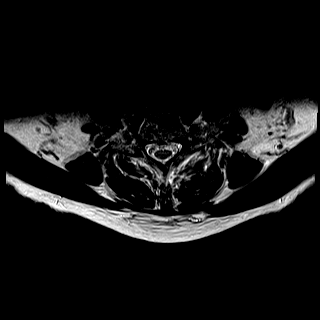
[im 9/27]
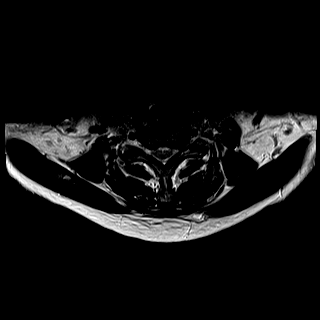
[im 11/27]
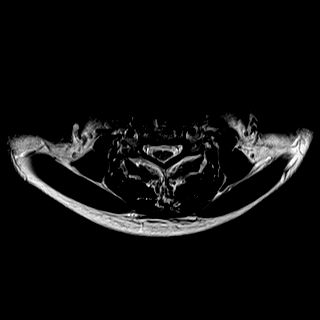
[im 13/27]
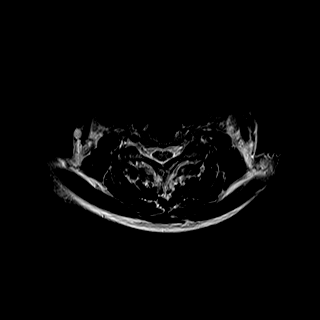
[im 15/27]
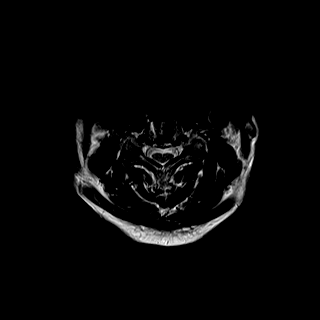
[im 17/27]
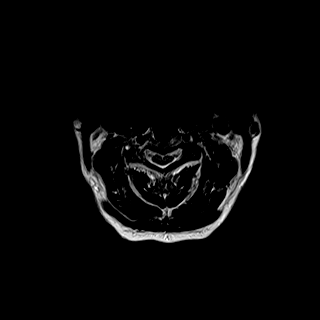
[im 19/27]
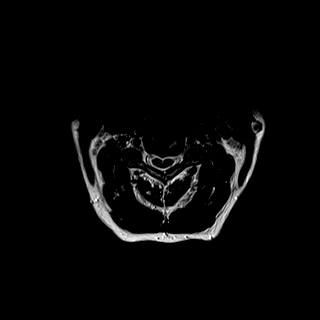
[im 23/27]
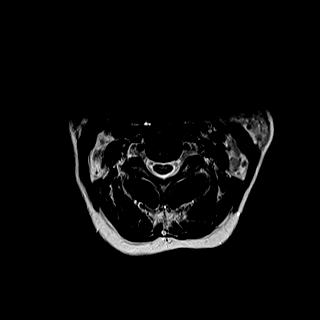
[im 27/27]
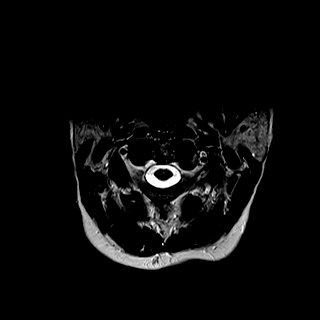

[Series 6: mpgr ax · axial · 3.0mm · 0.35mm/px · z∈[-186,-90]mm · 8 of 27 slices shown]
[im 1/27]
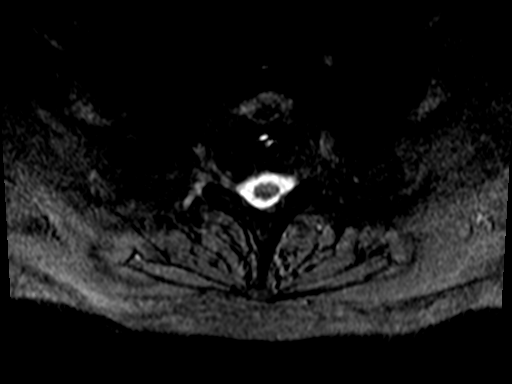
[im 5/27]
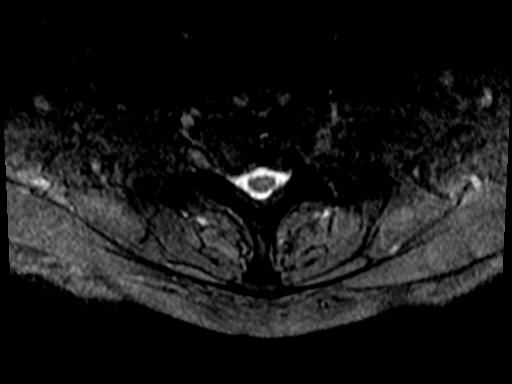
[im 9/27]
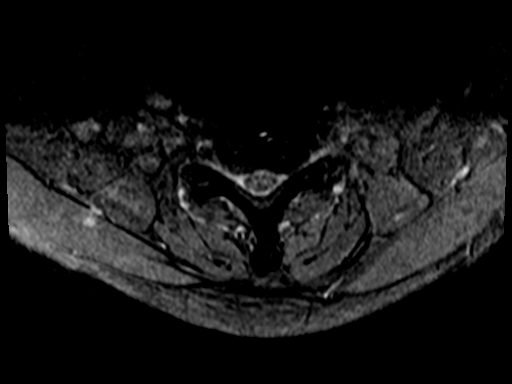
[im 13/27]
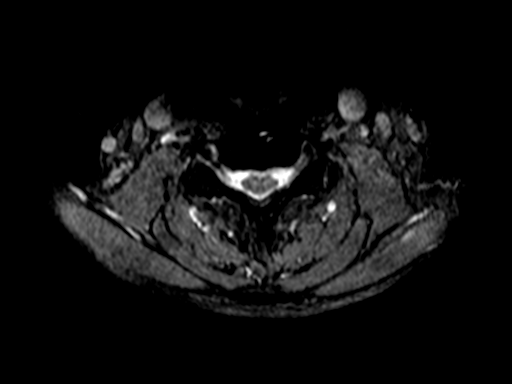
[im 15/27]
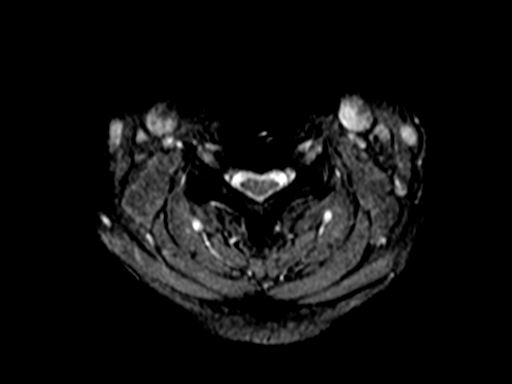
[im 19/27]
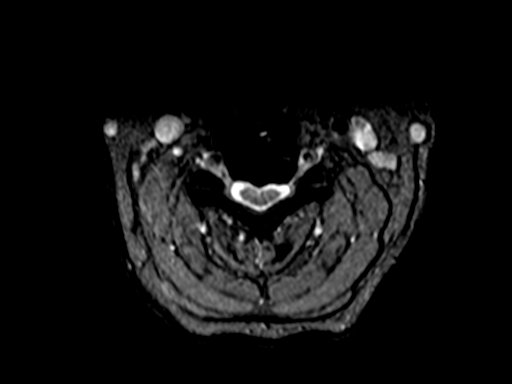
[im 23/27]
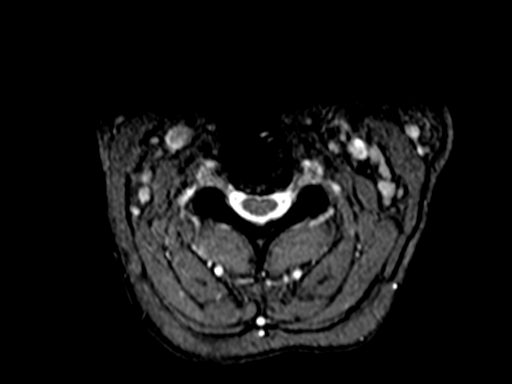
[im 27/27]
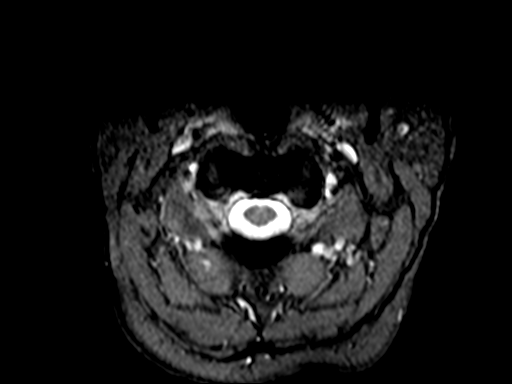

[40 of 48 positions shown; findings below may reference images not displayed]

FINDINGS: Alignment: No significant listhesis.

Vertebrae: Interval postoperative changes since 2269 MRI. There is
evidence of anterior fusion with plate and screw fixation and
interbody spacers spanning C3-C6. The hardware is not well evaluated
on this study and there is associated susceptibility artifact. There
is no marrow edema or suspicious osseous lesion.

Cord: No definite abnormal signal.

Posterior Fossa, vertebral arteries, paraspinal tissues:
Unremarkable.

Disc levels:

C2-C3: Disc bulge. Mild canal stenosis. No significant foraminal
stenosis.

C3-C4: Operative level. Probable bridging osteophytes or
ossification of the posterior longitudinal ligament. Improved canal
stenosis. No significant foraminal stenosis. Osteophytes.

C4-C5: Operative level. Endplate osteophytes. Improved canal
stenosis. No significant foraminal stenosis.

C5-C6: Endplate osteophytes. Improved canal stenosis. No significant
foraminal stenosis.

C6-C7: Disc bulge with endplate osteophytes and uncovertebral
hypertrophy. Mild canal stenosis. Moderate to marked right and
marked left foraminal stenosis. Appearance is similar.

C7-T1: Disc bulge with endplate osteophytes and facet hypertrophy.
No canal stenosis. Similar moderate to marked foraminal stenosis.
IMPRESSION: Significant improved canal stenosis at operative levels. There is
bilateral foraminal stenosis at C6-C7 and C7-T1 similar to the prior
study.

## 2021-01-14 IMAGING — MR MR HEAD W/O CM
6 of 7 series · 37 of 48 positions shown · non-contrast
Comparison: None.

CLINICAL DATA: Migraines, numbness and tingling in right hand

EXAM:
MRI HEAD WITHOUT CONTRAST
TECHNIQUE: Multiplanar, multiecho pulse sequences of the brain and surrounding
structures were obtained without intravenous contrast.

[Series 3: DWI · axial · 3.0mm · 1.20mm/px · z∈[-66,+96]mm · 9 of 55 slices shown (1 of 2)]
[im 1/55]
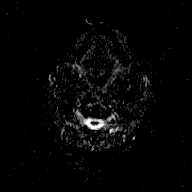
[im 7/55]
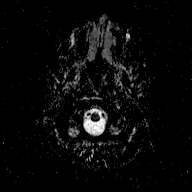
[im 14/55]
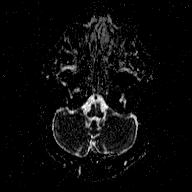
[im 21/55]
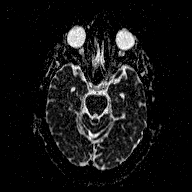
[im 28/55]
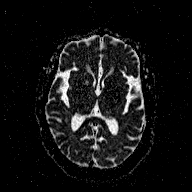
[im 34/55]
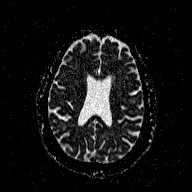
[im 41/55]
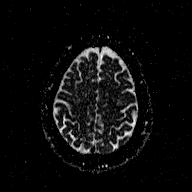
[im 48/55]
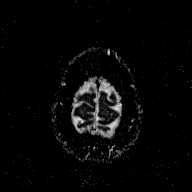
[im 55/55]
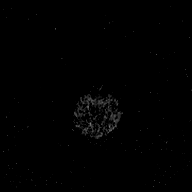

[Series 5: DWI · coronal · 3.0mm · 1.15mm/px · 8 of 45 slices shown (2 of 2)]
[im 1/45]
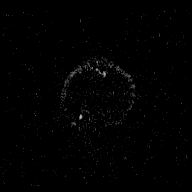
[im 7/45]
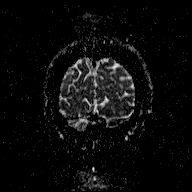
[im 13/45]
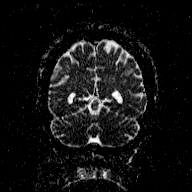
[im 19/45]
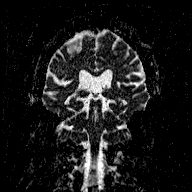
[im 26/45]
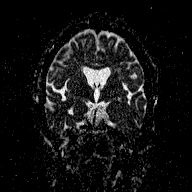
[im 32/45]
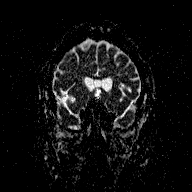
[im 38/45]
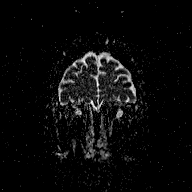
[im 45/45]
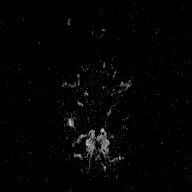

[Series 6: T1 · sagittal · 5.0mm · 0.45mm/px · 3 of 23 slices shown]
[im 1/23]
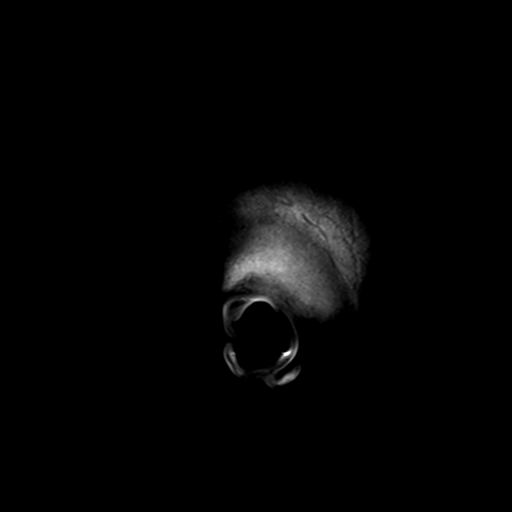
[im 8/23]
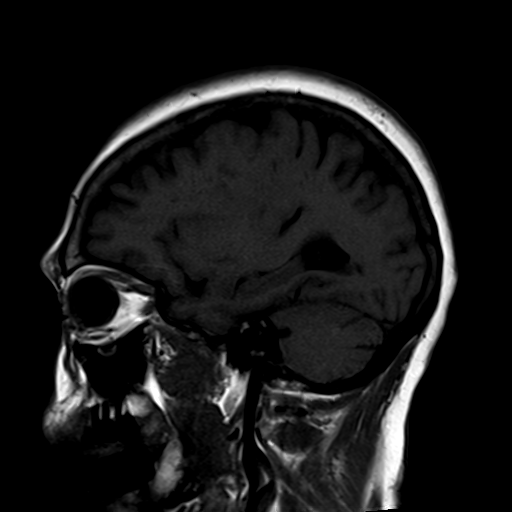
[im 15/23]
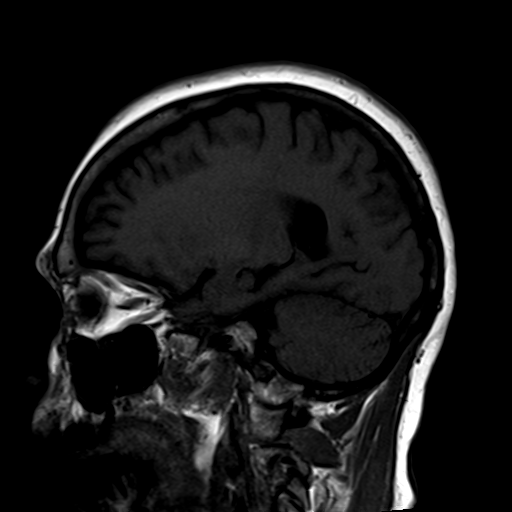

[Series 8: FLAIR · axial · 3.0mm · 0.45mm/px · z∈[-56,+106]mm · 8 of 55 slices shown]
[im 1/55]
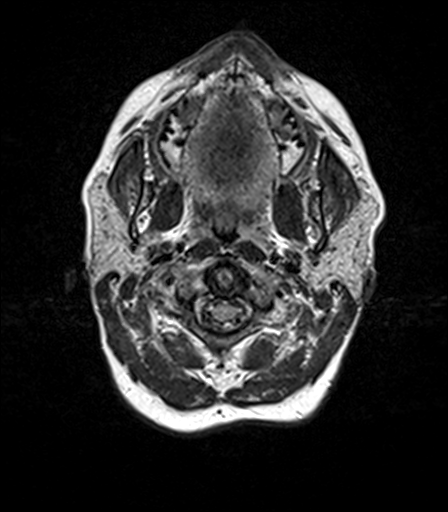
[im 7/55]
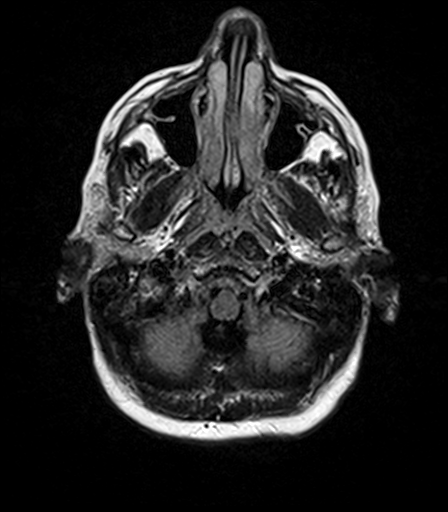
[im 19/55]
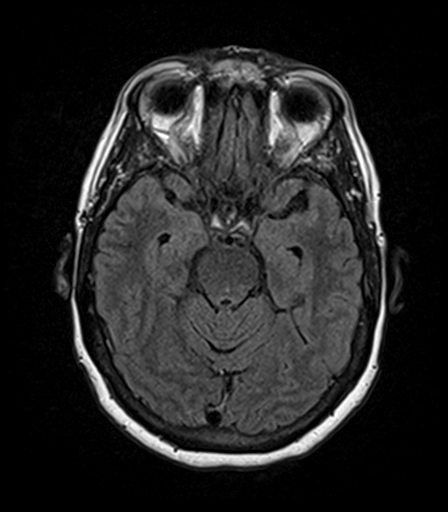
[im 25/55]
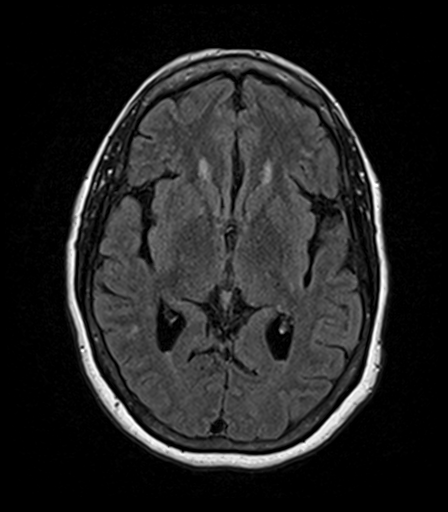
[im 31/55]
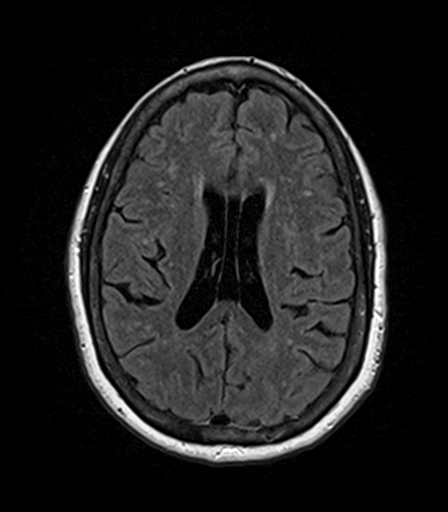
[im 37/55]
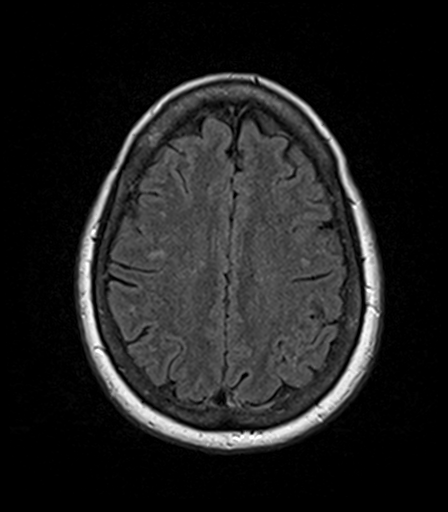
[im 49/55]
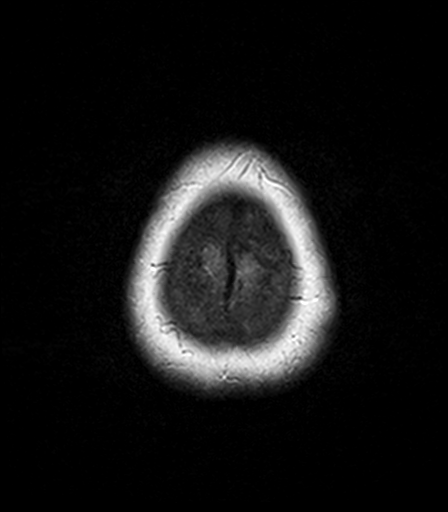
[im 55/55]
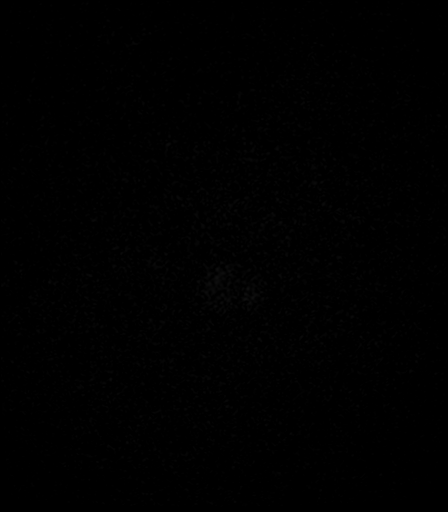

[Series 9: T2 · axial · 5.0mm · 0.72mm/px · z∈[-52,+102]mm · 4 of 23 slices shown (1 of 2)]
[im 1/23]
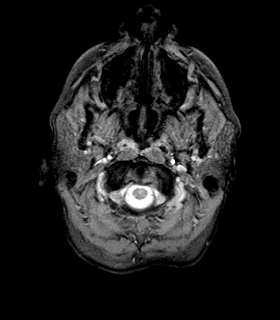
[im 8/23]
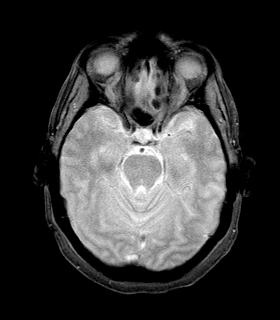
[im 15/23]
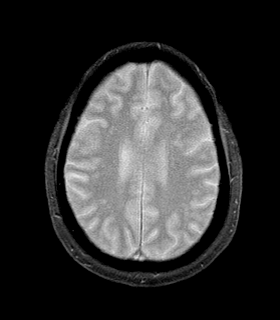
[im 23/23]
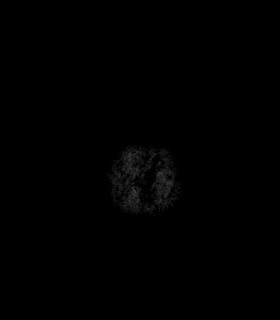

[Series 11: T2 · coronal · 5.0mm · 0.43mm/px · 5 of 28 slices shown (2 of 2)]
[im 1/28]
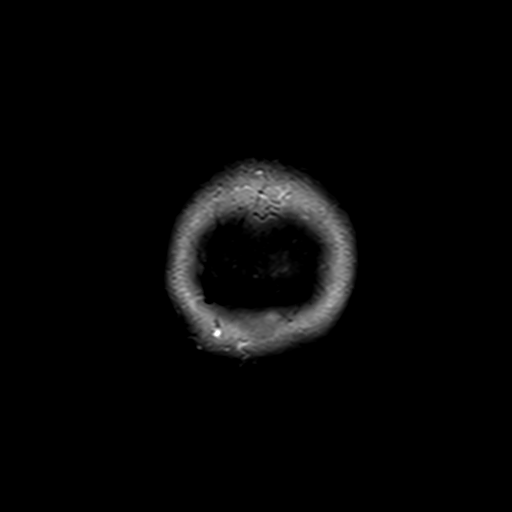
[im 7/28]
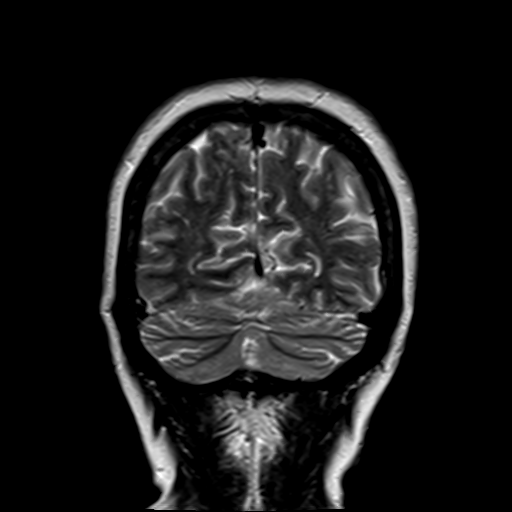
[im 14/28]
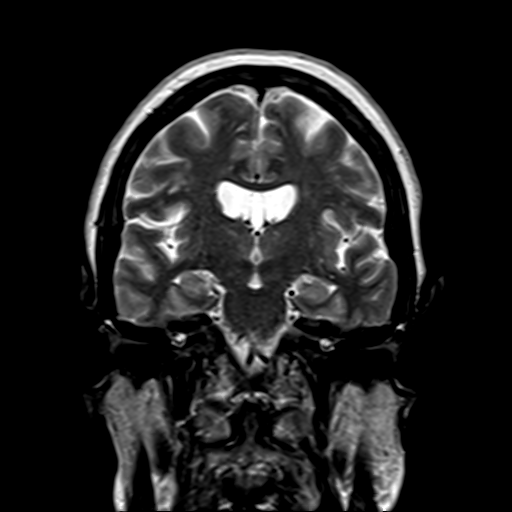
[im 21/28]
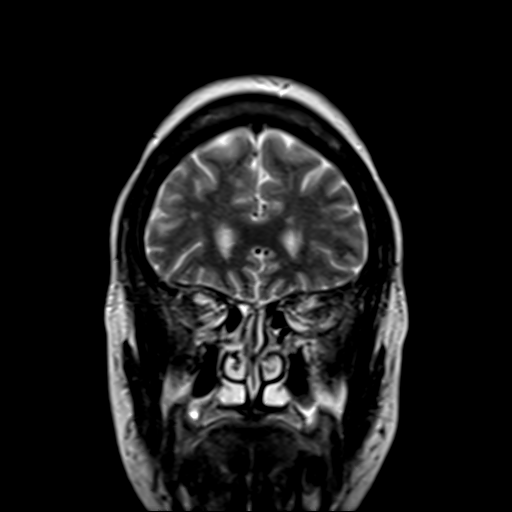
[im 28/28]
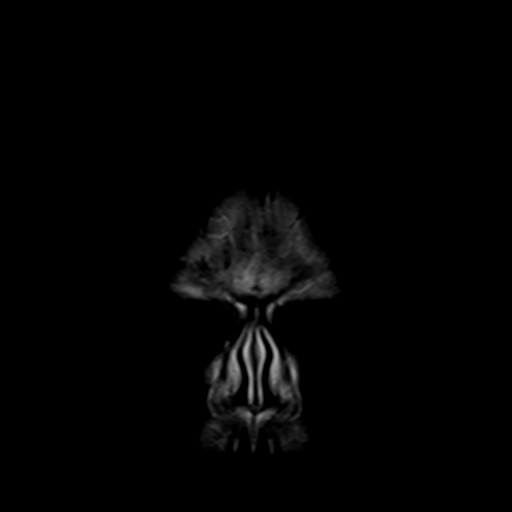

[37 of 48 positions shown; findings below may reference images not displayed]

FINDINGS: Brain: There is no acute infarction or intracranial hemorrhage.
Multiple foci of T2 hyperintensity are present in the supratentorial
periventricular and subcortical white matter. Ventricles and sulci
are within normal limits in size and configuration. There is no
intracranial mass, mass effect, or edema. There is no hydrocephalus
or extra-axial fluid collection.

Vascular: Major vessel flow voids at the skull base are preserved.

Skull and upper cervical spine: Normal marrow signal is preserved.

Sinuses/Orbits: Mild mucosal thickening.  Orbits are unremarkable.

Other: Sella is unremarkable.  Mastoid air cells are clear.
IMPRESSION: No acute abnormality. Mild to moderate burden of white matter
lesions likely reflecting nonspecific gliosis/demyelination.
